# Patient Record
Sex: Female | Born: 1990 | Hispanic: No | Marital: Married | Smoking: Never smoker
Health system: Southern US, Community
[De-identification: ages and names within clinical notes are randomized; demographics above are authoritative.]

## PROBLEM LIST (undated history)

## (undated) DIAGNOSIS — E039 Hypothyroidism, unspecified: Secondary | ICD-10-CM

## (undated) HISTORY — PX: NO PAST SURGERIES: SHX2092

## (undated) HISTORY — DX: Hypothyroidism, unspecified: E03.9

---

## 2020-01-01 ENCOUNTER — Other Ambulatory Visit: Payer: Self-pay | Admitting: Obstetrics & Gynecology

## 2020-01-01 DIAGNOSIS — N6310 Unspecified lump in the right breast, unspecified quadrant: Secondary | ICD-10-CM

## 2020-01-04 ENCOUNTER — Other Ambulatory Visit: Payer: Self-pay

## 2020-01-04 ENCOUNTER — Ambulatory Visit
Admission: RE | Admit: 2020-01-04 | Discharge: 2020-01-04 | Disposition: A | Discharge status: Home / Self Care | Payer: BC Managed Care – PPO | Source: Ambulatory Visit | Attending: Obstetrics & Gynecology | Admitting: Obstetrics & Gynecology

## 2020-01-04 ENCOUNTER — Other Ambulatory Visit: Payer: Self-pay | Admitting: Obstetrics & Gynecology

## 2020-01-04 DIAGNOSIS — N6310 Unspecified lump in the right breast, unspecified quadrant: Secondary | ICD-10-CM

## 2020-07-08 ENCOUNTER — Other Ambulatory Visit: Payer: BC Managed Care – PPO

## 2020-09-01 ENCOUNTER — Other Ambulatory Visit: Payer: BC Managed Care – PPO

## 2020-10-08 ENCOUNTER — Ambulatory Visit
Admission: RE | Admit: 2020-10-08 | Discharge: 2020-10-08 | Disposition: A | Discharge status: Home / Self Care | Payer: 59 | Source: Ambulatory Visit | Attending: Obstetrics & Gynecology | Admitting: Obstetrics & Gynecology

## 2020-10-08 ENCOUNTER — Other Ambulatory Visit: Payer: Self-pay

## 2020-10-08 ENCOUNTER — Other Ambulatory Visit: Payer: Self-pay | Admitting: Obstetrics & Gynecology

## 2020-10-08 DIAGNOSIS — N6310 Unspecified lump in the right breast, unspecified quadrant: Secondary | ICD-10-CM

## 2021-04-13 ENCOUNTER — Inpatient Hospital Stay: Admission: RE | Admit: 2021-04-13 | Payer: 59 | Source: Ambulatory Visit

## 2021-06-18 LAB — OB RESULTS CONSOLE GC/CHLAMYDIA
Chlamydia: NEGATIVE
Neisseria Gonorrhea: NEGATIVE

## 2021-06-18 LAB — OB RESULTS CONSOLE ABO/RH: RH Type: POSITIVE

## 2021-06-18 LAB — OB RESULTS CONSOLE HIV ANTIBODY (ROUTINE TESTING): HIV: NONREACTIVE

## 2021-06-18 LAB — OB RESULTS CONSOLE HEPATITIS B SURFACE ANTIGEN: Hepatitis B Surface Ag: NEGATIVE

## 2021-06-18 LAB — OB RESULTS CONSOLE RPR: RPR: NONREACTIVE

## 2021-06-18 LAB — OB RESULTS CONSOLE RUBELLA ANTIBODY, IGM: Rubella: IMMUNE

## 2021-06-28 NOTE — L&D Delivery Note (Addendum)
Delivery Note At 12:38 AM a viable and healthy female was delivered via Vaginal, Spontaneous (Presentation: Right Occiput Anterior).  APGAR: 8, 9; weight  .pending  Placenta status: Spontaneous;Pathology, Intact.  Cord: 3 vessels with the following complications: Loose nuchal., reduced over shoulder.  Cord pH: NA  Thick pea soup meconium noted and baby hot on palpation but transitioned well   Anesthesia: Epidural Episiotomy: None Lacerations: bilateral vaginal;2nd degree;Perineal Suture Repair: 3.0 vicryl rapide Est. Blood Loss (mL):  400  Mom to postpartum.  Baby to Couplet care / Skin to Skin.  Robley Fries 01/12/2022, 1:20 AM

## 2021-10-05 ENCOUNTER — Other Ambulatory Visit (HOSPITAL_COMMUNITY): Payer: Self-pay | Admitting: Obstetrics & Gynecology

## 2021-10-05 DIAGNOSIS — N6310 Unspecified lump in the right breast, unspecified quadrant: Secondary | ICD-10-CM

## 2021-11-05 ENCOUNTER — Other Ambulatory Visit: Payer: Self-pay | Admitting: Obstetrics & Gynecology

## 2021-11-05 DIAGNOSIS — R590 Localized enlarged lymph nodes: Secondary | ICD-10-CM

## 2021-11-19 ENCOUNTER — Ambulatory Visit
Admission: RE | Admit: 2021-11-19 | Discharge: 2021-11-19 | Disposition: A | Discharge status: Home / Self Care | Payer: 59 | Source: Ambulatory Visit | Attending: Obstetrics & Gynecology | Admitting: Obstetrics & Gynecology

## 2021-11-19 DIAGNOSIS — R590 Localized enlarged lymph nodes: Secondary | ICD-10-CM

## 2021-12-08 ENCOUNTER — Other Ambulatory Visit: Payer: Self-pay | Admitting: Obstetrics & Gynecology

## 2021-12-08 DIAGNOSIS — Z3689 Encounter for other specified antenatal screening: Secondary | ICD-10-CM

## 2021-12-09 ENCOUNTER — Encounter: Payer: Self-pay | Admitting: *Deleted

## 2021-12-09 ENCOUNTER — Ambulatory Visit: Payer: 59

## 2021-12-10 ENCOUNTER — Encounter: Payer: Self-pay | Admitting: *Deleted

## 2021-12-10 ENCOUNTER — Ambulatory Visit: Payer: 59

## 2021-12-10 ENCOUNTER — Ambulatory Visit: Payer: 59 | Admitting: *Deleted

## 2021-12-10 ENCOUNTER — Ambulatory Visit: Payer: 59 | Attending: Obstetrics & Gynecology

## 2021-12-10 VITALS — BP 109/73 | HR 98 | Ht 65.0 in

## 2021-12-10 DIAGNOSIS — E039 Hypothyroidism, unspecified: Secondary | ICD-10-CM | POA: Diagnosis present

## 2021-12-10 DIAGNOSIS — Z3689 Encounter for other specified antenatal screening: Secondary | ICD-10-CM

## 2021-12-10 DIAGNOSIS — O99283 Endocrine, nutritional and metabolic diseases complicating pregnancy, third trimester: Secondary | ICD-10-CM | POA: Diagnosis present

## 2021-12-18 LAB — OB RESULTS CONSOLE GBS: GBS: NEGATIVE

## 2022-01-10 ENCOUNTER — Inpatient Hospital Stay (EMERGENCY_DEPARTMENT_HOSPITAL)
Admission: AD | Admit: 2022-01-10 | Discharge: 2022-01-10 | Disposition: A | Discharge status: Home / Self Care | Payer: 59 | Source: Home / Self Care | Attending: Obstetrics | Admitting: Obstetrics

## 2022-01-10 ENCOUNTER — Encounter (HOSPITAL_COMMUNITY): Payer: Self-pay | Admitting: Obstetrics

## 2022-01-10 DIAGNOSIS — Z3689 Encounter for other specified antenatal screening: Secondary | ICD-10-CM | POA: Insufficient documentation

## 2022-01-10 DIAGNOSIS — Z3A39 39 weeks gestation of pregnancy: Secondary | ICD-10-CM | POA: Insufficient documentation

## 2022-01-10 DIAGNOSIS — O471 False labor at or after 37 completed weeks of gestation: Secondary | ICD-10-CM | POA: Insufficient documentation

## 2022-01-10 DIAGNOSIS — Z3A40 40 weeks gestation of pregnancy: Secondary | ICD-10-CM | POA: Diagnosis not present

## 2022-01-10 MED ORDER — MORPHINE SULFATE (PF) 4 MG/ML IV SOLN: 4.0000 mg | Freq: Once | INTRAVENOUS | Status: DC

## 2022-01-10 MED ORDER — ZOLPIDEM TARTRATE 5 MG PO TABS
5.0000 mg | ORAL_TABLET | Freq: Once | ORAL | Status: AC
  Administered 2022-01-10: 5 mg via ORAL
  Filled 2022-01-10: qty 1

## 2022-01-10 MED ORDER — MORPHINE SULFATE (PF) 4 MG/ML IV SOLN
4.0000 mg | Freq: Once | INTRAVENOUS | Status: AC
  Administered 2022-01-10: 4 mg via INTRAMUSCULAR
  Filled 2022-01-10: qty 1

## 2022-01-10 NOTE — MAU Provider Note (Signed)
None      S: Ms. Benny Henrie is a 31 y.o. G1P0 at [redacted]w[redacted]d  who presents to MAU today complaining contractions. She was evaluated, by the nurse, and after two hours no cervical change was noted. She  reports pain medication and provider to bedside.  Patient reports she has not slept in 2 days and is scheduled for eIOL on July 20th.  She reports normal fetal movement and denies LOF or VB.   O: BP 121/62 (BP Location: Right Arm)   Pulse 92   Temp 97.7 F (36.5 C) (Oral)   Resp 17   Ht 5\' 5"  (1.651 m)   Wt 80.9 kg   LMP 04/06/2021   SpO2 98%   BMI 29.69 kg/m  GENERAL: Well-developed, well-nourished female in no acute distress.  HEAD: Normocephalic, atraumatic.  CHEST: Normal effort of breathing, regular heart rate ABDOMEN: Soft, nontender, gravid  Cervical exam:  Dilation: 1 Effacement (%): 60 Cervical Position: Posterior Station: -2 Presentation: Vertex Exam by:: [email or web address] RN   Fetal Monitoring: Baseline: 135 Variability: Moderate Accelerations: Present 15x15 Decelerations: None Contractions: Q6-7 min   A: SIUP at [redacted]w[redacted]d  False labor Cat I FT Contraction Pain  P: -Briefly educated on latent labor.  -Reviewed therapeutic rest regimen of morphine and ambien. -Discussed anticipated effects and potential side effects.  -Patient and husband verbalizes understanding and desires to proceed.-Encouraged to go home and rest.  -Morphine 4mg  IM and Ambien 5mg  orally ordered. -NST reactive -Instructed to return to hospital prn for labor onset or other pregnancy related concerns. -Discharge to home in stable condition.   [redacted]w[redacted]d, CNM 01/10/2022 7:04 AM

## 2022-01-10 NOTE — MAU Note (Signed)
Taylor Wong is a 31 y.o. at [redacted]w[redacted]d here in MAU reporting: ctx that started yesterday morning around 0300 but weren't consistent.Marland Kitchen around 0200 today ctx started to be 5 minutes apart. Denies VB or LOF. +FM.    Onset of complaint: 0200 Pain score: 8 Vitals:   01/10/22 0426  BP: 121/77  Pulse: (!) 112  Resp: 19  Temp: 97.9 F (36.6 C)  SpO2: 94%    FHT: pt wearing dress - will apply FHR monitor in room.  Lab orders placed from triage: labor eval.

## 2022-01-11 ENCOUNTER — Inpatient Hospital Stay (HOSPITAL_COMMUNITY): Payer: 59 | Admitting: Anesthesiology

## 2022-01-11 ENCOUNTER — Other Ambulatory Visit: Payer: Self-pay | Admitting: Obstetrics & Gynecology

## 2022-01-11 ENCOUNTER — Other Ambulatory Visit: Payer: Self-pay

## 2022-01-11 ENCOUNTER — Inpatient Hospital Stay (HOSPITAL_COMMUNITY): Admit: 2022-01-11 | Payer: Self-pay

## 2022-01-11 ENCOUNTER — Inpatient Hospital Stay (HOSPITAL_COMMUNITY)
Admission: AD | Admit: 2022-01-11 | Discharge: 2022-01-14 | DRG: 805 | Disposition: A | Discharge status: Home / Self Care | Payer: 59 | Attending: Obstetrics & Gynecology | Admitting: Obstetrics & Gynecology

## 2022-01-11 ENCOUNTER — Encounter (HOSPITAL_COMMUNITY): Payer: Self-pay | Admitting: Obstetrics and Gynecology

## 2022-01-11 DIAGNOSIS — O411290 Chorioamnionitis, unspecified trimester, not applicable or unspecified: Secondary | ICD-10-CM | POA: Diagnosis not present

## 2022-01-11 DIAGNOSIS — Z3A40 40 weeks gestation of pregnancy: Secondary | ICD-10-CM

## 2022-01-11 DIAGNOSIS — O99284 Endocrine, nutritional and metabolic diseases complicating childbirth: Secondary | ICD-10-CM | POA: Diagnosis present

## 2022-01-11 DIAGNOSIS — O9902 Anemia complicating childbirth: Secondary | ICD-10-CM | POA: Diagnosis present

## 2022-01-11 DIAGNOSIS — Z3689 Encounter for other specified antenatal screening: Secondary | ICD-10-CM | POA: Diagnosis not present

## 2022-01-11 DIAGNOSIS — O26893 Other specified pregnancy related conditions, third trimester: Secondary | ICD-10-CM | POA: Diagnosis present

## 2022-01-11 DIAGNOSIS — E039 Hypothyroidism, unspecified: Secondary | ICD-10-CM | POA: Diagnosis present

## 2022-01-11 DIAGNOSIS — O411230 Chorioamnionitis, third trimester, not applicable or unspecified: Secondary | ICD-10-CM | POA: Diagnosis present

## 2022-01-11 LAB — CBC
HCT: 40.9 % (ref 36.0–46.0)
Hemoglobin: 12.8 g/dL (ref 12.0–15.0)
MCH: 22.5 pg — ABNORMAL LOW (ref 26.0–34.0)
MCHC: 31.3 g/dL (ref 30.0–36.0)
MCV: 71.8 fL — ABNORMAL LOW (ref 80.0–100.0)
Platelets: 332 10*3/uL (ref 150–400)
RBC: 5.7 MIL/uL — ABNORMAL HIGH (ref 3.87–5.11)
RDW: 18.3 % — ABNORMAL HIGH (ref 11.5–15.5)
WBC: 10.4 10*3/uL (ref 4.0–10.5)
nRBC: 0 % (ref 0.0–0.2)

## 2022-01-11 LAB — TYPE AND SCREEN
ABO/RH(D): B POS
Antibody Screen: NEGATIVE

## 2022-01-11 LAB — RPR: RPR Ser Ql: NONREACTIVE

## 2022-01-11 MED ORDER — PHENYLEPHRINE 80 MCG/ML (10ML) SYRINGE FOR IV PUSH (FOR BLOOD PRESSURE SUPPORT)
80.0000 ug | PREFILLED_SYRINGE | INTRAVENOUS | Status: DC | PRN
  Filled 2022-01-11: qty 10

## 2022-01-11 MED ORDER — OXYTOCIN BOLUS FROM INFUSION
333.0000 mL | Freq: Once | INTRAVENOUS | Status: AC
  Administered 2022-01-12: 333 mL via INTRAVENOUS

## 2022-01-11 MED ORDER — PHENYLEPHRINE 80 MCG/ML (10ML) SYRINGE FOR IV PUSH (FOR BLOOD PRESSURE SUPPORT): 80.0000 ug | PREFILLED_SYRINGE | INTRAVENOUS | Status: DC | PRN

## 2022-01-11 MED ORDER — DIPHENHYDRAMINE HCL 50 MG/ML IJ SOLN: 12.5000 mg | INTRAMUSCULAR | Status: DC | PRN

## 2022-01-11 MED ORDER — MORPHINE SULFATE (PF) 4 MG/ML IV SOLN
4.0000 mg | Freq: Once | INTRAVENOUS | Status: AC
  Administered 2022-01-11: 4 mg via INTRAMUSCULAR
  Filled 2022-01-11: qty 1

## 2022-01-11 MED ORDER — FENTANYL-BUPIVACAINE-NACL 0.5-0.125-0.9 MG/250ML-% EP SOLN
12.0000 mL/h | EPIDURAL | Status: DC | PRN
  Administered 2022-01-11: 12 mL/h via EPIDURAL
  Filled 2022-01-11: qty 250

## 2022-01-11 MED ORDER — OXYCODONE-ACETAMINOPHEN 5-325 MG PO TABS: 2.0000 | ORAL_TABLET | ORAL | Status: DC | PRN

## 2022-01-11 MED ORDER — EPHEDRINE 5 MG/ML INJ: 10.0000 mg | INTRAVENOUS | Status: DC | PRN

## 2022-01-11 MED ORDER — LIDOCAINE HCL (PF) 1 % IJ SOLN: 30.0000 mL | INTRAMUSCULAR | Status: DC | PRN

## 2022-01-11 MED ORDER — SOD CITRATE-CITRIC ACID 500-334 MG/5ML PO SOLN: 30.0000 mL | ORAL | Status: DC | PRN

## 2022-01-11 MED ORDER — ACETAMINOPHEN 325 MG PO TABS
650.0000 mg | ORAL_TABLET | ORAL | Status: DC | PRN
  Administered 2022-01-11 (×2): 650 mg via ORAL
  Filled 2022-01-11 (×2): qty 2

## 2022-01-11 MED ORDER — OXYTOCIN-SODIUM CHLORIDE 30-0.9 UT/500ML-% IV SOLN
2.5000 [IU]/h | INTRAVENOUS | Status: DC
  Administered 2022-01-12: 2.5 [IU]/h via INTRAVENOUS

## 2022-01-11 MED ORDER — OXYCODONE-ACETAMINOPHEN 5-325 MG PO TABS: 1.0000 | ORAL_TABLET | ORAL | Status: DC | PRN

## 2022-01-11 MED ORDER — LACTATED RINGERS IV SOLN: INTRAVENOUS | Status: DC

## 2022-01-11 MED ORDER — SODIUM CHLORIDE 0.9 % IV SOLN
2.0000 g | Freq: Four times a day (QID) | INTRAVENOUS | Status: DC
  Administered 2022-01-11 – 2022-01-12 (×2): 2 g via INTRAVENOUS
  Filled 2022-01-11 (×4): qty 2

## 2022-01-11 MED ORDER — LACTATED RINGERS IV SOLN
500.0000 mL | Freq: Once | INTRAVENOUS | Status: AC
  Administered 2022-01-11: 500 mL via INTRAVENOUS

## 2022-01-11 MED ORDER — ONDANSETRON HCL 4 MG/2ML IJ SOLN
4.0000 mg | Freq: Once | INTRAMUSCULAR | Status: AC
Start: 2022-01-11 — End: 2022-01-11
  Administered 2022-01-11: 4 mg via INTRAMUSCULAR
  Filled 2022-01-11: qty 2

## 2022-01-11 MED ORDER — LIDOCAINE HCL (PF) 1 % IJ SOLN
INTRAMUSCULAR | Status: DC | PRN
  Administered 2022-01-11 (×2): 4 mL via EPIDURAL

## 2022-01-11 MED ORDER — ONDANSETRON HCL 4 MG/2ML IJ SOLN: 4.0000 mg | Freq: Four times a day (QID) | INTRAMUSCULAR | Status: DC | PRN

## 2022-01-11 MED ORDER — LACTATED RINGERS IV SOLN
500.0000 mL | INTRAVENOUS | Status: DC | PRN
  Administered 2022-01-11 (×2): 500 mL via INTRAVENOUS

## 2022-01-11 MED ORDER — TERBUTALINE SULFATE 1 MG/ML IJ SOLN
0.2500 mg | Freq: Once | INTRAMUSCULAR | Status: DC | PRN
Start: 2022-01-11 — End: 2022-01-12

## 2022-01-11 MED ORDER — FLEET ENEMA 7-19 GM/118ML RE ENEM: 1.0000 | ENEMA | RECTAL | Status: DC | PRN

## 2022-01-11 MED ORDER — OXYTOCIN-SODIUM CHLORIDE 30-0.9 UT/500ML-% IV SOLN
1.0000 m[IU]/min | INTRAVENOUS | Status: DC
  Administered 2022-01-11: 2 m[IU]/min via INTRAVENOUS
  Filled 2022-01-11: qty 500

## 2022-01-11 NOTE — H&P (Addendum)
Taylor Wong is a 31 y.o. female G1P0 [redacted]w[redacted]d presenting for early labor. Patient reported contractions since last night. Had been seen in MAU yesterday morning with complaints of contractions but no cervical change and was discharged home. No VB or LOF. Good FM.   Pregnancy dated by LMP c/w 8w sono.Routine prenatal care at [facility] with Dr. Juliene Pina as primary provider. Routine prenatal labs were within normal limits. She had a low risk Ultrascreen and negative AFP1 screen. Fetal anatomy scan significant for bilateral mild fetal pyelectasis at 19 weeks. Follow up scan at 27 weeks showed pyelectasis had resolved but prominent lateral ventricles noted. Repeat US at 34 weeks done showed mild left ventriculomegaly at 10.4 mm and again normal kidneys noted. She was then seen by MFM at 35 weeks where normal ventricles noted, no concerns, and vertex presentation with EFW 6#1 (58%). Pregnancy significant for hypothyroidism on levothyroxine 50 mcg daily with stable TFTs throughout the pregnancy. Normal 1hr GTT of 114, mild anemia Hgb 10.8 at 28 week check. Has been on PO Fe supplement since then. GBS NEG.  OB History     Gravida  1   Para      Term      Preterm      AB      Living         SAB      IAB      Ectopic      Multiple      Live Births             Past Medical History:  Diagnosis Date   Hypothyroidism    Past Surgical History:  Procedure Laterality Date   NO PAST SURGERIES     Family History: family history is not on file. Social History:  reports that she has never smoked. She has never used smokeless tobacco. She reports that she does not drink alcohol and does not use drugs.     Maternal Diabetes: No Genetic Screening: Normal Maternal Ultrasounds/Referrals: Normal Fetal anatomy scan significant for bilateral mild fetal pyelectasis at 19 weeks. Follow up scan at 27 weeks showed pyelectasis had resolved but prominent lateral ventricles noted. Repeat US at 34  weeks done showed mild left ventriculomegaly at 10.4 mm and again normal kidneys noted. She was then seen by MFM at 35 weeks where normal ventricles noted, no concerns Fetal Ultrasounds or other Referrals:  Referred to Materal Fetal Medicine  Maternal Substance Abuse:  No Significant Maternal Medications:  Meds include: Syntroid Significant Maternal Lab Results:  Group B Strep negative Other Comments:  None  Review of Systems  All other systems reviewed and are negative.  Per HPI Maternal Exam:  Abdomen: Estimated fetal weight is 6#1 at 35 weeks.   Fetal presentation: vertex Introitus: Normal vulva.   Fetal Exam Fetal State Assessment: Category I - tracings are normal.   Physical Exam Vitals and nursing note reviewed.  Constitutional:      Appearance: Normal appearance.  HENT:     Head: Normocephalic.  Cardiovascular:     Rate and Rhythm: Normal rate.  Pulmonary:     Effort: Pulmonary effort is normal.  Abdominal:     Tenderness: There is no abdominal tenderness.  Genitourinary:    General: Normal vulva.  Musculoskeletal:        General: Normal range of motion.     Cervical back: Normal range of motion.  Skin:    General: Skin is warm and dry.  Neurological:  General: No focal deficit present.     Mental Status: She is alert and oriented to person, place, and time.     Dilation: 2.5 Effacement (%): 80 Station: -2 Exam by:: katie jones Blood pressure 120/84, pulse (!) 132, temperature 98.6 F (37 C), temperature source Axillary, resp. rate 18, height 5\' 5"  (1.651 m), weight 80.7 kg, last menstrual period 04/06/2021, SpO2 97 %.  Prenatal labs: ABO, Rh:  --/--/B POS (07/17 1000) Antibody: NEG (07/17 1000) Rubella:  IMMUNE RPR:   NON-REACTIVE HBsAg:   NEGATIVE HIV:   NON-REACTIVE GBS: Negative/-- (06/23 0000)   ChemistryNo results for input(s): "NA", "K", "CL", "CO2", "GLUCOSE", "BUN", "CREATININE", "CALCIUM", "GFRNONAA", "GFRAA", "ANIONGAP" in the last 168  hours.  No results for input(s): "PROT", "ALBUMIN", "AST", "ALT", "ALKPHOS", "BILITOT" in the last 168 hours. Hematology Recent Labs  Lab 01/11/22 1000  WBC 10.4  RBC 5.70*  HGB 12.8  HCT 40.9  MCV 71.8*  MCH 22.5*  MCHC 31.3  RDW 18.3*  PLT 332   Cardiac EnzymesNo results for input(s): "TROPONINI" in the last 168 hours. No results for input(s): "TROPIPOC" in the last 168 hours.  BNPNo results for input(s): "BNP", "PROBNP" in the last 168 hours.  DDimer No results for input(s): "DDIMER" in the last 168 hours.   Assessment/Plan: Taylor Wong is a 31 y.o. female G1P0 [redacted]w[redacted]d admitted with early labor at term  -Admit to LD -Routine admission labs -Labor augmentation with Pitocin -Cont EFM/Toco- Cat I  -May have epidural pain mgmt on request -GBS NEG, Rh POS -Hypothyroid, cont home Levo 50 mcg daily -Routine intrapartum care -Anticipate SVD  Cassandra A Law 01/11/2022, 11:54 AM

## 2022-01-11 NOTE — Progress Notes (Signed)
RN called w/ fever, fetal variability decreased, low urine op. Pt pretty certain no PPROM.  Arom in house few hrs back   BP (!) 112/57   Pulse (!) 119   Temp (!) 101.1 F (38.4 C) (Axillary)   Resp 18   Ht 5\' 5"  (1.651 m)   Wt 80.7 kg   LMP 04/06/2021   SpO2 100%   BMI 29.62 kg/m  Maternal tachycardia but since admission   FHT baseline change now from 150 to 165, minimal variability. No decels. + accels.   possible chorio, start Mefoxin 2 gm q  hrs

## 2022-01-11 NOTE — Progress Notes (Addendum)
Taylor Wong is a 31 y.o. G1P0 at [redacted]w[redacted]d, early labor admit  Subjective: Right leg is hurting. Has been in right lateral and when turned to left lateral starting having severe right thigh pain.   Objective: BP 116/72   Pulse (!) 143   Temp (!) 102.7 F (39.3 C) (Axillary)   Resp 17   Ht 5\' 5"  (1.651 m)   Wt 80.7 kg   LMP 04/06/2021   SpO2 100%   BMI 29.62 kg/m  Patient Vitals for the past 24 hrs:  BP Temp Temp src Pulse Resp SpO2 Height Weight  01/11/22 2230 116/72 (!) 102.7 F (39.3 C) Axillary (!) 143 -- -- -- --  01/11/22 2200 111/65 99.4 F (37.4 C) Oral (!) 131 17 -- -- --  01/11/22 2130 97/60 -- -- (!) 136 16 -- -- --  01/11/22 2100 115/60 -- -- (!) 131 17 -- -- --  01/11/22 2031 (!) 134/98 -- -- -- 17 -- -- --  01/11/22 2001 (!) 124/57 -- -- (!) 122 17 -- -- --  01/11/22 1930 113/60 100 F (37.8 C) Oral (!) 130 -- -- -- --  01/11/22 1900 114/66 -- -- (!) 112 -- -- -- --  01/11/22 1830 120/75 (!) 101.1 F (38.4 C) Axillary (!) 118 -- -- -- --  01/11/22 1800 (!) 112/57 -- -- (!) 119 -- -- -- --  01/11/22 1733 -- 100.3 F (37.9 C) Axillary -- -- -- -- --  01/11/22 1731 104/74 -- -- (!) 112 -- -- -- --  01/11/22 1708 -- 100 F (37.8 C) Axillary -- -- -- -- --  01/11/22 1701 108/61 -- -- (!) 112 -- -- -- --  01/11/22 1632 (!) 114/57 -- -- (!) 116 -- -- -- --  01/11/22 1628 -- 99.3 F (37.4 C) Axillary -- -- -- -- --  01/11/22 1618 122/90 -- -- (!) 120 -- -- -- --  01/11/22 1530 118/75 -- -- (!) 105 -- -- -- --  01/11/22 1500 111/70 -- -- (!) 103 -- -- -- --  01/11/22 1431 115/75 98.6 F (37 C) Axillary (!) 102 -- -- -- --  01/11/22 1400 124/76 -- -- (!) 106 -- -- -- --  01/11/22 1337 103/89 -- -- (!) 113 -- -- -- --  01/11/22 1335 103/89 -- -- (!) 113 -- -- -- --  01/11/22 1330 116/67 -- -- (!) 114 -- -- -- --  01/11/22 1320 109/87 -- -- (!) 113 -- 100 % -- --  01/11/22 1316 125/80 -- -- (!) 125 -- 99 % -- --  01/11/22 1312 114/67 -- -- (!) 143 -- -- -- --   01/11/22 1309 -- -- -- -- -- 100 % -- --  01/11/22 1307 (!) 120/52 -- -- (!) 145 18 -- -- --  01/11/22 1301 123/89 -- -- (!) 103 -- -- -- --  01/11/22 1238 111/74 -- -- (!) 105 -- -- -- --  01/11/22 1130 -- 98.6 F (37 C) Axillary -- -- -- -- --  01/11/22 1110 -- -- -- -- 18 -- 5\' 5"  (1.651 m) 80.7 kg  01/11/22 0852 120/84 (!) 97.5 F (36.4 C) Oral (!) 132 20 97 % -- --  01/11/22 0849 -- -- -- -- -- -- 5\' 5"  (1.651 m) 80.9 kg     FHT:  FHR: 180s  bpm, variability: minimal ,  accelerations:  Abscent,  decelerations:  Present intermittent variable decels, now possible late decels. Baseline change from 140-150s at admission to 180s  for at least 3- hrs since maternal fever.  UC:   Regular on external toco. IUP placed since Cx unchanges in 2.1/2 hrs. IUPC not picking up well  SVE:   Dilation: 6 Effacement (%): 100 Station: -1 Exam by:: Amirr Achord MD No change since last exam before 8 pm Vaginal exam - feels hot   Assessment / Plan: Early labor, AOL with pitocin. S/p AROM in hospital but suspect leaking and maternal fever, maternal fetal tachycardia c/w Chorioamnionitis  On Cefoxitin 2 gm q 6 hrs  Start IV bolus 200 cc and reassess if temp/tachy better  GBS neg Cervix unchanged, IUPC placed. If no progress or fetal concerns, will proceed w/ C-section, patient agrees  Stat CBC, CMP   Robley Fries, MD 01/11/2022, 11:03 PM

## 2022-01-11 NOTE — Anesthesia Preprocedure Evaluation (Signed)
Anesthesia Evaluation  Patient identified by MRN, date of birth, ID band Patient awake    Reviewed: Allergy & Precautions, Patient's Chart, lab work & pertinent test results  History of Anesthesia Complications Negative for: history of anesthetic complications  Airway Mallampati: II  TM Distance: >3 FB Neck ROM: Full    Dental no notable dental hx.    Pulmonary neg pulmonary ROS,    Pulmonary exam normal        Cardiovascular negative cardio ROS Normal cardiovascular exam     Neuro/Psych negative neurological ROS  negative psych ROS   GI/Hepatic negative GI ROS, Neg liver ROS,   Endo/Other  Hypothyroidism   Renal/GU negative Renal ROS  negative genitourinary   Musculoskeletal negative musculoskeletal ROS (+)   Abdominal   Peds  Hematology negative hematology ROS (+)   Anesthesia Other Findings Day of surgery medications reviewed with patient.  Reproductive/Obstetrics (+) Pregnancy                             Anesthesia Physical Anesthesia Plan  ASA: 2  Anesthesia Plan: Epidural   Post-op Pain Management:    Induction:   PONV Risk Score and Plan: Treatment may vary due to age or medical condition  Airway Management Planned: Natural Airway  Additional Equipment: Fetal Monitoring  Intra-op Plan:   Post-operative Plan:   Informed Consent: I have reviewed the patients History and Physical, chart, labs and discussed the procedure including the risks, benefits and alternatives for the proposed anesthesia with the patient or authorized representative who has indicated his/her understanding and acceptance.       Plan Discussed with:   Anesthesia Plan Comments:         Anesthesia Quick Evaluation

## 2022-01-11 NOTE — Progress Notes (Addendum)
Taylor Wong is a 31 y.o. G1P0 at [redacted]w[redacted]d by LMP admitted for early labor, she had been in MAU over the weekend .  Subjective: Feels well since epidural   Objective: BP 108/61   Pulse (!) 112   Temp 100 F (37.8 C) (Axillary)   Resp 18   Ht 5\' 5"  (1.651 m)   Wt 80.7 kg   LMP 04/06/2021   SpO2 100%   BMI 29.62 kg/m  No intake/output data recorded. No intake/output data recorded.  FHT:  FHR: 150 bpm, variability: moderate,  accelerations:  Present,  decelerations:  Absent UC:   regular, every 3-4 minutes on pitocin SVE:   Dilation: 3.5 Effacement (%): 100 Station: -2 Exam by:: dr. Wallice Granville Slightly warn on vaginal exam. Mec per AROM. No foul odor   Labs: Lab Results  Component Value Date   WBC 10.4 01/11/2022   HGB 12.8 01/11/2022   HCT 40.9 01/11/2022   MCV 71.8 (L) 01/11/2022   PLT 332 01/11/2022    Assessment / Plan: Labor augmentation, early labor.   Fetal Wellbeing:  Category I Pain Control:  Epidural I/D:   GBS neg  Anticipated MOD:  working towards SVD    01/13/2022, MD 01/11/2022, 5:31 PM

## 2022-01-11 NOTE — Progress Notes (Signed)
S: Comfortable with epidural. Discussed the R/B/A of AROM for labor augmentation and patient consents to procedure. Husband, Ajay, present and supportive. Eagerly anticipating a baby girl with an undecided name.   O: Vitals:   01/11/22 1320 01/11/22 1330 01/11/22 1335 01/11/22 1337  BP: 109/87 116/67 103/89 103/89  Pulse: (!) 113 (!) 114 (!) 113 (!) 113  Resp:      Temp:      TempSrc:      SpO2: 100%     Weight:      Height:       FHT:  FHR: 135 bpm, variability: moderate,  accelerations:  Present,  decelerations:  Absent UC:   irregular, every 3-5.5 minutes SVE:   Dilation: 3 Effacement (%): 90 Station: -2 Exam by: A. Elana Jian CNM  AROM of a scant amount of thick meconium fluid at 1355.   A / P: Augmentation of labor, progressing well, Pitocin infusing  Fetal Wellbeing:  Category I GBS: Negative Pain Control:  Epidural Anticipated MOD:  NSVD  Will continue to increase Pitocin 2x2 until active labor.   Dr. Juliene Pina updated on patient status and plan of care.   June Leap, CNM, MSN 01/11/2022, 2:06 PM

## 2022-01-11 NOTE — MAU Provider Note (Signed)
Event Date/Time   First Provider Initiated Contact with Patient 01/11/22 [phone]       S: Ms. Taylor Wong is a 31 y.o. G1P0 at [redacted]w[redacted]d  who presents to MAU today complaining of regular contractions since last night. She denies vaginal bleeding. She denies LOF. She reports normal fetal movement. Rates pain 9/10.   O: BP 120/84 (BP Location: Right Arm)   Pulse (!) 132   Temp (!) 97.5 F (36.4 C) (Oral)   Resp 20   LMP 04/06/2021   SpO2 97%  GENERAL: Well-developed, well-nourished female in no acute distress.  HEAD: Normocephalic, atraumatic.  CHEST: Normal effort of breathing, regular heart rate ABDOMEN: Soft, nontender, gravid  Cervical exam:  Dilation: 1.5 Effacement (%): 70 Cervical Position: Middle Station: -2 Presentation: Vertex Exam by:: Holly Flippin RN  Fetal Monitoring: Baseline: 140 Variability: mod Accelerations: + Decelerations: no Contractions: 2-4  MDM: Therapeutic rest per pt request. Morphine and Zofran ordered. 0930: Occasional variable noted on tracing, recommend admit for early labor/post dates. RN to notify MD.  A: SIUP at [redacted]w[redacted]d  Early labor Reactive NST  P: Admit to LD Mngt per Dr. Lorayne Marek, Doyle, CNM 01/11/2022 9:09 AM

## 2022-01-11 NOTE — MAU Note (Signed)
.  Taylor Wong is a 31 y.o. at [redacted]w[redacted]d here in MAU reporting: contractions that have continued since last night. She states that they are now closer together and more intense. Denies VB or LOF. +FM.   Pain score: 9

## 2022-01-11 NOTE — Anesthesia Procedure Notes (Signed)
Epidural Patient location during procedure: OB Start time: 01/11/2022 12:55 PM End time: 01/11/2022 1:00 PM  Staffing Anesthesiologist: Kaylyn Layer, MD Performed: anesthesiologist   Preanesthetic Checklist Completed: patient identified, IV checked, risks and benefits discussed, monitors and equipment checked, pre-op evaluation and timeout performed  Epidural Patient position: sitting Prep: DuraPrep and site prepped and draped Patient monitoring: continuous pulse ox, blood pressure and heart rate Approach: midline Location: L2-L3 Injection technique: LOR air  Needle:  Needle type: Tuohy  Needle gauge: 17 G Needle length: 9 cm Catheter type: closed end flexible Catheter size: 19 Gauge Catheter at skin depth: 8 cm Test dose: negative and Other (1% lidocaine)  Assessment Events: blood not aspirated, injection not painful, no injection resistance, no paresthesia and negative IV test  Additional Notes Patient identified. Risks, benefits, and alternatives discussed with patient including but not limited to bleeding, infection, nerve damage, paralysis, failed block, incomplete pain control, headache, blood pressure changes, nausea, vomiting, reactions to medication, itching, and postpartum back pain. Confirmed with bedside nurse the patient's most recent platelet count. Confirmed with patient that they are not currently taking any anticoagulation, have any bleeding history, or any family history of bleeding disorders. Patient expressed understanding and wished to proceed. All questions were answered. Sterile technique was used throughout the entire procedure. Please see nursing notes for vital signs.   First attempt at L3-4 with unintentional dural puncture. Second attempt at L2-3 with crisp LOR between 3-3.5cm, catheter threaded easily. Test dose was given through epidural catheter and negative prior to continuing to dose epidural or start infusion. Warning signs of high block given to  the patient including shortness of breath, tingling/numbness in hands, complete motor block, or any concerning symptoms with instructions to call for help. Patient was given instructions on fall risk and not to get out of bed. All questions and concerns addressed with instructions to call with any issues or inadequate analgesia.  Reason for block:procedure for pain

## 2022-01-12 ENCOUNTER — Encounter (HOSPITAL_COMMUNITY): Payer: Self-pay | Admitting: Obstetrics and Gynecology

## 2022-01-12 DIAGNOSIS — E039 Hypothyroidism, unspecified: Secondary | ICD-10-CM | POA: Diagnosis present

## 2022-01-12 LAB — CBC
HCT: 36.1 % (ref 36.0–46.0)
Hemoglobin: 11.2 g/dL — ABNORMAL LOW (ref 12.0–15.0)
MCH: 22.4 pg — ABNORMAL LOW (ref 26.0–34.0)
MCHC: 31 g/dL (ref 30.0–36.0)
MCV: 72.1 fL — ABNORMAL LOW (ref 80.0–100.0)
Platelets: 267 10*3/uL (ref 150–400)
RBC: 5.01 MIL/uL (ref 3.87–5.11)
RDW: 17.8 % — ABNORMAL HIGH (ref 11.5–15.5)
WBC: 19.6 10*3/uL — ABNORMAL HIGH (ref 4.0–10.5)
nRBC: 0 % (ref 0.0–0.2)

## 2022-01-12 MED ORDER — BENZOCAINE-MENTHOL 20-0.5 % EX AERO
1.0000 | INHALATION_SPRAY | CUTANEOUS | Status: DC | PRN
  Administered 2022-01-12: 1 via TOPICAL
  Filled 2022-01-12: qty 56

## 2022-01-12 MED ORDER — ONDANSETRON HCL 4 MG/2ML IJ SOLN: 4.0000 mg | INTRAMUSCULAR | Status: DC | PRN

## 2022-01-12 MED ORDER — ONDANSETRON HCL 4 MG PO TABS: 4.0000 mg | ORAL_TABLET | ORAL | Status: DC | PRN

## 2022-01-12 MED ORDER — IBUPROFEN 600 MG PO TABS
600.0000 mg | ORAL_TABLET | Freq: Four times a day (QID) | ORAL | Status: DC
  Administered 2022-01-12 – 2022-01-14 (×9): 600 mg via ORAL
  Filled 2022-01-12 (×9): qty 1

## 2022-01-12 MED ORDER — LEVOTHYROXINE SODIUM 50 MCG PO TABS
50.0000 ug | ORAL_TABLET | Freq: Every day | ORAL | Status: DC
  Administered 2022-01-12 – 2022-01-14 (×3): 50 ug via ORAL
  Filled 2022-01-12 (×3): qty 1

## 2022-01-12 MED ORDER — DIBUCAINE (PERIANAL) 1 % EX OINT
1.0000 | TOPICAL_OINTMENT | CUTANEOUS | Status: DC | PRN
Start: 2022-01-12 — End: 2022-01-14

## 2022-01-12 MED ORDER — DIPHENHYDRAMINE HCL 25 MG PO CAPS: 25.0000 mg | ORAL_CAPSULE | Freq: Four times a day (QID) | ORAL | Status: DC | PRN

## 2022-01-12 MED ORDER — ZOLPIDEM TARTRATE 5 MG PO TABS: 5.0000 mg | ORAL_TABLET | Freq: Every evening | ORAL | Status: DC | PRN

## 2022-01-12 MED ORDER — TETANUS-DIPHTH-ACELL PERTUSSIS 5-2.5-18.5 LF-MCG/0.5 IM SUSY: 0.5000 mL | PREFILLED_SYRINGE | Freq: Once | INTRAMUSCULAR | Status: DC

## 2022-01-12 MED ORDER — ACETAMINOPHEN 325 MG PO TABS
650.0000 mg | ORAL_TABLET | ORAL | Status: DC | PRN
  Administered 2022-01-12 – 2022-01-13 (×3): 650 mg via ORAL
  Filled 2022-01-12 (×3): qty 2

## 2022-01-12 MED ORDER — SIMETHICONE 80 MG PO CHEW: 80.0000 mg | CHEWABLE_TABLET | ORAL | Status: DC | PRN

## 2022-01-12 MED ORDER — WITCH HAZEL-GLYCERIN EX PADS: 1.0000 | MEDICATED_PAD | CUTANEOUS | Status: DC | PRN

## 2022-01-12 MED ORDER — COCONUT OIL OIL: 1.0000 | TOPICAL_OIL | Status: DC | PRN

## 2022-01-12 MED ORDER — SENNOSIDES-DOCUSATE SODIUM 8.6-50 MG PO TABS
2.0000 | ORAL_TABLET | Freq: Every day | ORAL | Status: DC
  Administered 2022-01-13 – 2022-01-14 (×2): 2 via ORAL
  Filled 2022-01-12 (×2): qty 2

## 2022-01-12 MED ORDER — PRENATAL MULTIVITAMIN CH
1.0000 | ORAL_TABLET | Freq: Every day | ORAL | Status: DC
  Administered 2022-01-12 – 2022-01-14 (×3): 1 via ORAL
  Filled 2022-01-12 (×3): qty 1

## 2022-01-12 NOTE — Anesthesia Postprocedure Evaluation (Signed)
Anesthesia Post Note  Patient: Taylor Wong  Procedure(s) Performed: AN AD HOC LABOR EPIDURAL     Patient location during evaluation: Mother Baby Anesthesia Type: Epidural Level of consciousness: awake and alert and oriented Pain management: satisfactory to patient Vital Signs Assessment: post-procedure vital signs reviewed and stable Respiratory status: respiratory function stable Cardiovascular status: stable Postop Assessment: no backache, epidural receding, patient able to bend at knees, no signs of nausea or vomiting, adequate PO intake and able to ambulate Anesthetic complications: yes Comments: Wet tap documented by MDA, The patient stated that she has had neck pain and stiffness since before delivery but it has gradually improved.  She also admits to a mild headache but attributes this to a lack of sleep and an uncomfortable bed.    No notable events documented.  Last Vitals:  Vitals:   01/12/22 0404 01/12/22 0746  BP: 103/72 103/72  Pulse: (!) 116 94  Resp:  18  Temp: 36.9 C 36.8 C  SpO2: 99% 99%    Last Pain:  Vitals:   01/12/22 0746  TempSrc: Oral  PainSc:    Pain Goal:                Epidural/Spinal Function Cutaneous sensation: Normal sensation (01/12/22 0746), Patient able to flex knees: Yes (01/12/22 0746), Patient able to lift hips off bed: Yes (01/12/22 0746), Back pain beyond tenderness at insertion site: No (01/12/22 0746), Progressively worsening motor and/or sensory loss: No (01/12/22 0746), Bowel and/or bladder incontinence post epidural: No (01/12/22 0746)  Karleen Dolphin

## 2022-01-12 NOTE — Lactation Note (Addendum)
This note was copied from a baby's chart. Lactation Consultation Note  Patient Name: Girl Gitty Osterlund [id number] Date: 01/12/2022   Age:30 hours  Mother sleeping.  Spoke with FOB.  Infant in nursery for temperature stability.  Lactation will follow up later.    Consult Status Consult Status: Follow-up    Dahlia Byes [facility] 01/12/2022, 9:09 AM

## 2022-01-12 NOTE — Lactation Note (Addendum)
This note was copied from a baby's chart. Lactation Consultation Note  Patient Name: Taylor Wong [id number] Date: 01/12/2022   Age:30 hours Mom as per RN, Taylor Wong, has a headache and is resting for now. Infant formula fed. Mom to call when she is ready to work on breastfeeding.   Maternal Data    Feeding Nipple Type: Extra Slow Flow  LATCH Score                    Lactation Tools Discussed/Used    Interventions    Discharge    Consult Status      Taylor Belote  Wong 01/12/2022, 3:30 PM

## 2022-01-12 NOTE — Progress Notes (Signed)
Post Partum Day 0 Chorioamnionitis in labor, on Mefoxin.  SVD, 2nd degree lac and vaginal lacerations Girl. Was in Nursery for low temp, back w/ parents. Breast/ formula as needed  Subjective: no complaints, up ad lib, voiding, tolerating PO, and + flatus  Objective: Blood pressure 90/61, pulse 94, temperature 98.4 F (36.9 C), temperature source Oral, resp. rate 18, height 5\' 5"  (1.651 m), weight 80.7 kg, last menstrual period 04/06/2021, SpO2 98 %, unknown if currently breastfeeding. Patient Vitals for the past 24 hrs:  BP Temp Temp src Pulse Resp SpO2  01/12/22 1217 90/61 98.4 F (36.9 C) Oral 94 18 98 %  01/12/22 0746 103/72 98.3 F (36.8 C) Oral 94 18 99 %  01/12/22 0404 103/72 98.4 F (36.9 C) Oral (!) 116 -- 99 %  01/12/22 0247 116/76 99.4 F (37.4 C) Axillary 66 18 98 %  01/12/22 0215 116/69 -- -- 91 -- --  01/12/22 0200 121/70 -- -- 97 -- --  01/12/22 0145 113/71 -- -- (!) 102 -- --  01/12/22 0130 112/78 -- -- 99 -- --  01/12/22 0115 122/65 (!) 100.6 F (38.1 C) Axillary (!) 119 16 --  01/12/22 0100 114/70 -- -- (!) 112 -- --  01/12/22 0045 120/63 -- -- (!) 129 -- --  01/12/22 0004 118/78 -- -- (!) 138 -- --  01/11/22 2300 (!) 102/50 -- -- (!) 114 -- --  01/11/22 2230 116/72 (!) 102.7 F (39.3 C) Axillary (!) 143 -- --  01/11/22 2200 111/65 99.4 F (37.4 C) Oral (!) 131 17 --  01/11/22 2130 97/60 -- -- (!) 136 16 --  01/11/22 2100 115/60 -- -- (!) 131 17 --  01/11/22 2031 (!) 134/98 -- -- -- 17 --  01/11/22 2001 (!) 124/57 -- -- (!) 122 17 --  01/11/22 1930 113/60 100 F (37.8 C) Oral (!) 130 -- --  01/11/22 1900 114/66 -- -- (!) 112 -- --  01/11/22 1830 120/75 (!) 101.1 F (38.4 C) Axillary (!) 118 -- --  01/11/22 1800 (!) 112/57 -- -- (!) 119 -- --  01/11/22 1733 -- 100.3 F (37.9 C) Axillary -- -- --  01/11/22 1731 104/74 -- -- (!) 112 -- --  01/11/22 1708 -- 100 F (37.8 C) Axillary -- -- --  01/11/22 1701 108/61 -- -- (!) 112 -- --  01/11/22 1632 (!)  114/57 -- -- (!) 116 -- --  01/11/22 1628 -- 99.3 F (37.4 C) Axillary -- -- --  01/11/22 1618 122/90 -- -- (!) 120 -- --  01/11/22 1530 118/75 -- -- (!) 105 -- --  01/11/22 1500 111/70 -- -- (!) 103 -- --  01/11/22 1431 115/75 98.6 F (37 C) Axillary (!) 102 -- --  01/11/22 1400 124/76 -- -- (!) 106 -- --  01/11/22 1337 103/89 -- -- (!) 113 -- --  01/11/22 1335 103/89 -- -- (!) 113 -- --    Physical Exam:  General: alert and cooperative Lochia: appropriate Uterine Fundus: firm, non tender  Incision: exam deferred today  DVT Evaluation: No evidence of DVT seen on physical exam. Negative Homan's sign.      Latest Ref Rng & Units 01/12/2022    5:37 AM 01/11/2022   10:00 AM  CBC  WBC 4.0 - 10.5 K/uL 19.6  10.4   Hemoglobin 12.0 - 15.0 g/dL 01/13/2022  02.6   Hematocrit 36.0 - 46.0 % 36.1  40.9   Platelets 150 - 400 K/uL 267  332  B+ Rub immune   Assessment/Plan: PPD # 0. SVD PP care - routine, LC to assist Chorio in labor- no more fevers, continue to observe, Cefoxitin last dose at delivery  Hypothyroid cont same dose     LOS: 1 day   Robley Fries, MD 01/12/2022, 1:28 PM

## 2022-01-13 LAB — SURGICAL PATHOLOGY

## 2022-01-13 NOTE — Progress Notes (Signed)
Epidural removed by anesthesia Dr. Chancy Milroy.

## 2022-01-13 NOTE — Lactation Note (Signed)
This note was copied from a baby's chart. Lactation Consultation Note  Patient Name: Taylor Wong ACZYS'A Date: 01/13/2022 Reason for consult: Follow-up assessment;Maternal endocrine disorder Age:30 hours  P1, Assisted with latching at breast in both football and cross cradle position.  Mother's areolas are very compressible.  Baby had difficult time sustaining latch.  A few sucks noted.  Attempted with 5 french feeding tube. Set up DEBP with 27 mm flanges and suggest mother pump q 3 hours. Used hand pump to evert nipple prior to latch attempt and supplement with breastmilk and formula. Plan is to continuing to attempt breastfeeding and call for help as needed.   Maternal Data Has patient been taught Hand Expression?: Yes Does the patient have breastfeeding experience prior to this delivery?: No  Feeding Mother's Current Feeding Choice: Breast Milk and Formula  LATCH Score Latch: Repeated attempts needed to sustain latch, nipple held in mouth throughout feeding, stimulation needed to elicit sucking reflex.  Audible Swallowing: None  Type of Nipple: Everted at rest and after stimulation (short shaft)  Comfort (Breast/Nipple): Soft / non-tender  Hold (Positioning): Assistance needed to correctly position infant at breast and maintain latch.  LATCH Score: 6   Lactation Tools Discussed/Used Tools: Pump;Flanges;51F feeding tube / Syringe  Interventions Interventions: Breast feeding basics reviewed;Assisted with latch;Hand express;Pre-pump if needed;Hand pump;DEBP;Education  Discharge Pump: DEBP;Personal Artelia Laroche)  Consult Status Consult Status: Follow-up Date: 01/14/22 Follow-up type: In-patient    Dahlia Byes [facility] 01/13/2022, 8:50 AM

## 2022-01-13 NOTE — Lactation Note (Addendum)
This note was copied from a baby's chart. Lactation Consultation Note  Patient Name: Taylor Wong WIOMB'T Date: 01/13/2022 Reason for consult: Follow-up assessment;Mother's request;Difficult latch;Primapara;1st time breastfeeding;Term;Breastfeeding assistance Age:31 hours  LC assisted with latching infant at the breast with signs of milk transfer. Parents aware to keep infant STS during feedings. RN to update chart on extent of this feeding.     Maternal Data Has patient been taught Hand Expression?: Yes  Feeding Mother's Current Feeding Choice: Breast Milk and Formula  LATCH Score Latch: Repeated attempts needed to sustain latch, nipple held in mouth throughout feeding, stimulation needed to elicit sucking reflex.  Audible Swallowing: Spontaneous and intermittent  Type of Nipple: Everted at rest and after stimulation  Comfort (Breast/Nipple): Soft / non-tender  Hold (Positioning): Assistance needed to correctly position infant at breast and maintain latch.  LATCH Score: 8   Lactation Tools Discussed/Used Tools: Pump;Flanges Flange Size: 24;27 Breast pump type: Double-Electric Breast Pump Pump Education: Setup, frequency, and cleaning;Milk Storage Reason for Pumping: increase stimulation Pumping frequency: post pump afer each feeding for 15 mins  Interventions Interventions: Breast feeding basics reviewed;Assisted with latch;Skin to skin;Breast massage;Hand express;Pre-pump if needed;Breast compression;Adjust position;Support pillows;Position options;Expressed milk;DEBP;Education;Pace feeding;LC Psychologist, educational;Visual merchandiser education  Discharge    Consult Status Consult Status: Follow-up Date: 01/14/22 Follow-up type: In-patient    [name]  [name] 01/13/2022, 11:21 PM

## 2022-01-13 NOTE — Progress Notes (Signed)
    PPD # 1 S/P SVD  Live born female  Birth Weight: 6 lb 12.3 oz (3070 g) APGAR: 8, 9  Newborn Delivery   Birth date/time: 01/12/2022 00:38:00 Delivery type: Vaginal, Spontaneous     Baby name: undecided Delivering provider: MODY, VAISHALI  Episiotomy:None   Lacerations:Vaginal;2nd degree;Perineal   Feeding: breast and bottle - reports desiring supplementation perceived low supply, reports seen by lactation   Pain control at delivery: Epidural   S:  Reports feeling well, better than yesterday             Tolerating po/ No nausea or vomiting             Bleeding is light             Pain controlled with acetaminophen and ibuprofen (OTC)             Up ad lib / ambulatory / voiding without difficulties   O:  A & O x 3, in no apparent distress              VS:  Vitals:   01/12/22 1217 01/12/22 1600 01/12/22 2015 01/13/22 0502  BP: 90/61 90/64 107/83 114/84  Pulse: 94 90 97 (!) 101  Resp: 18 18 16 18   Temp: 98.4 F (36.9 C) (!) 97.5 F (36.4 C) 97.9 F (36.6 C) 97.7 F (36.5 C)  TempSrc: Oral Oral Oral Oral  SpO2: 98% 98% 100% 100%  Weight:      Height:        LABS:  Recent Labs    01/11/22 1000 01/12/22 0537  WBC 10.4 19.6*  HGB 12.8 11.2*  HCT 40.9 36.1  PLT 332 267    Blood type: --/--/B POS (07/17 1000)  Rubella: Immune (12/22 0000)   I&O: I/O last 3 completed shifts: In: -  Out: 900 [Urine:500; Blood:400]          No intake/output data recorded.  Vaccines: TDaP          up to date   Gen: AAO x 3, NAD  Abdomen: soft, non-tender, non-distended             Fundus: firm, non-tender, U- 1  Perineum: well approximated  Lochia: small  Extremities: no calf pain or tenderness    A/P: PPD # 1 31 y.o., G1P1001   Principal Problem:   Postpartum care following vaginal delivery 7/18 Active Problems:   Normal labor and delivery   Chorioamnionitis - afebrile last 24 hours, no evidence of endometritis, continue to monitor, last dose Cefoxitin at  delivery   SVD 7/18   Second degree perineal laceration with bilateral vaginal lacerations - Dermoplast, Tuck pads   Hypothyroidism - Synthroid 50 mcg PO daily    Doing well - stable status  Routine post partum orders  Lactation support  Anticipate discharge tomorrow    8/18, BSN, RN, [facility] 01/13/2022, 11:42 AM

## 2022-01-13 NOTE — Anesthesia Post-op Follow-up Note (Signed)
  Anesthesia Pain Follow-up Note  Patient: Taylor Wong  Day #: 1  Date of Follow-up: 01/13/2022 Time: 12:24 PM  Last Vitals:  Vitals:   01/12/22 2015 01/13/22 0502  BP: 107/83 114/84  Pulse: 97 (!) 101  Resp: 16 18  Temp: 36.6 C 36.5 C  SpO2: 100% 100%    Level of Consciousness: alert  Pain: mild   Side Effects:None and patient sitting up  Catheter Site Exam:clean     Plan: Catheter removed/tip intact at surgeon's request  Trevor Iha

## 2022-01-13 NOTE — Lactation Note (Addendum)
This note was copied from a baby's chart. Lactation Consultation Note  Patient Name: Taylor Wong JASNK'N Date: 01/13/2022 Reason for consult: Follow-up assessment;Mother's request;Difficult latch;Term;Breastfeeding assistance Age:30 hours Mom working on getting infant to latch at the breast offering more volume with 5 french primed with formula. LC assisted setting up latch, primed with formula (54ml). Mom still latched and feeding, infant mostly sucking as formula is pushed.  Mom pre pumping with hand pump before latching.   Mom encouraged to get into pumping routine to maintain her milk supply.   Plan 1. To feed based on cues 8-12x 24hr period.  2. Mom latch with assistance of 5 french, then follow with supplement. If infant breaks latch, Mom continue supplementation with pace bottle feeding and slow flow nipple. BF supplementation guide provided. (18-25 ml per feeding) If infant not latching at the breast to offer more.  3. Post pump after each feeding for 15 mins   RN, Pleas Koch, to check pump as Mom states only one side is working.   All questions answered at the end of the visit.  Maternal Data    Feeding Mother's Current Feeding Choice: Breast Milk and Formula  LATCH Score Latch: Repeated attempts needed to sustain latch, nipple held in mouth throughout feeding, stimulation needed to elicit sucking reflex.  Audible Swallowing: A few with stimulation (swallowing with supplement at the breast only.)  Type of Nipple: Flat  Comfort (Breast/Nipple): Soft / non-tender  Hold (Positioning): Assistance needed to correctly position infant at breast and maintain latch.  LATCH Score: 6   Lactation Tools Discussed/Used Tools: 30F feeding tube / Syringe  Interventions Interventions: Breast feeding basics reviewed;Assisted with latch;Breast massage;Breast compression;Expressed milk;Comfort gels;Education;Pace feeding;LC Psychologist, educational;Warden/ranger education  Discharge    Consult Status Consult Status: Follow-up Date: 01/14/22 Follow-up type: In-patient    Taylor Wong  Taylor Wong 01/13/2022, 6:37 PM

## 2022-01-14 ENCOUNTER — Inpatient Hospital Stay (HOSPITAL_COMMUNITY): Payer: 59

## 2022-01-14 ENCOUNTER — Inpatient Hospital Stay (HOSPITAL_COMMUNITY): Admission: AD | Admit: 2022-01-14 | Payer: 59 | Source: Home / Self Care | Admitting: Obstetrics & Gynecology

## 2022-01-14 MED ORDER — COCONUT OIL OIL: 1.0000 | TOPICAL_OIL | 0 refills | Status: AC | PRN

## 2022-01-14 MED ORDER — ACETAMINOPHEN 325 MG PO TABS: 650.0000 mg | ORAL_TABLET | ORAL | Status: AC | PRN

## 2022-01-14 MED ORDER — IBUPROFEN 600 MG PO TABS: 600.0000 mg | ORAL_TABLET | Freq: Four times a day (QID) | ORAL | 0 refills | Status: AC

## 2022-01-14 MED ORDER — BENZOCAINE-MENTHOL 20-0.5 % EX AERO: 1.0000 | INHALATION_SPRAY | CUTANEOUS | Status: AC | PRN

## 2022-01-14 NOTE — Discharge Summary (Signed)
OB Discharge Summary  Patient Name: Taylor Wong DOB: 07-05-1990 MRN: [id number]  Date of admission: 01/11/2022 Delivering provider: MODY, VAISHALI   Admitting diagnosis: Normal labor and delivery [O80] SVD (spontaneous vaginal delivery) [O80] Intrauterine pregnancy: [redacted]w[redacted]d     Secondary diagnosis: Patient Active Problem List   Diagnosis Date Noted   SVD 7/18 01/12/2022   Postpartum care following vaginal delivery 7/18 01/12/2022   Second degree perineal laceration with bilateral vaginal lacerations 01/12/2022   Hypothyroidism 01/12/2022   Normal labor and delivery 01/11/2022   Chorioamnionitis 01/11/2022   Additional problems:none   Date of discharge: 01/14/2022   Discharge diagnosis: Principal Problem:   Postpartum care following vaginal delivery 7/18 Active Problems:   Normal labor and delivery   Chorioamnionitis   SVD 7/18   Second degree perineal laceration with bilateral vaginal lacerations   Hypothyroidism                                                              Post partum procedures: none  Augmentation: AROM and Pitocin Pain control: Epidural  Laceration:Vaginal;2nd degree;Perineal  Episiotomy:None  Complications: Intrauterine Inflammation or infection (Chorioamniotis)  Hospital course:  Onset of Labor With Vaginal Delivery      31 y.o. yo G1P1001 at [redacted]w[redacted]d was admitted in Latent Labor on 01/11/2022. Patient had an uncomplicated labor course as follows:  Membrane Rupture Time/Date: 1:55 PM ,01/11/2022   Delivery Method:Vaginal, Spontaneous  Episiotomy: None  Lacerations:  Vaginal;2nd degree;Perineal  Patient had an uncomplicated postpartum course.  She is ambulating, tolerating a regular diet, passing flatus, and urinating well. Patient is discharged home in stable condition on 01/14/22.  Newborn Data: Birth date:01/12/2022  Birth time:12:38 AM  Gender:Female  Living status:Living  Apgars:8 ,9  Weight:3070 g   Physical exam  Vitals:   01/12/22  2015 01/13/22 0502 01/13/22 1450 01/13/22 2029  BP: 107/83 114/84 109/83 109/79  Pulse: 97 (!) 101 94 86  Resp: 16 18 18 19   Temp: 97.9 F (36.6 C) 97.7 F (36.5 C) 97.9 F (36.6 C) 98.1 F (36.7 C)  TempSrc: Oral Oral Oral Oral  SpO2: 100% 100% 100% 100%  Weight:      Height:       General: alert, cooperative, and no distress Lochia: appropriate Uterine Fundus: firm Incision: N/A Perineum: repair intact, no edema DVT Evaluation: No cords or calf tenderness. No significant calf/ankle edema. Labs: Lab Results  Component Value Date   WBC 19.6 (H) 01/12/2022   HGB 11.2 (L) 01/12/2022   HCT 36.1 01/12/2022   MCV 72.1 (L) 01/12/2022   PLT 267 01/12/2022       No data to display            01/13/2022   12:33 AM  Edinburgh Postnatal Depression Scale Screening Tool  I have been able to laugh and see the funny side of things. 0  I have looked forward with enjoyment to things. 0  I have blamed myself unnecessarily when things went wrong. 0  I have been anxious or worried for no good reason. 0  I have felt scared or panicky for no good reason. 0  Things have been getting on top of me. 1  I have been so unhappy that I have had difficulty sleeping. 1  I have felt sad  or miserable. 0  I have been so unhappy that I have been crying. 0  The thought of harming myself has occurred to me. 0  Edinburgh Postnatal Depression Scale Total 2   Vaccines: TDaP          UTD   Discharge instruction:  per After Visit Summary,  [facility] OB booklet and  "Understanding Mother & Baby Care" hospital booklet  After Visit Meds:  Allergies as of 01/14/2022   No Known Allergies      Medication List     TAKE these medications    acetaminophen 325 MG tablet Commonly known as: Tylenol Take 2 tablets (650 mg total) by mouth every 4 (four) hours as needed (for pain scale < 4).   benzocaine-Menthol 20-0.5 % Aero Commonly known as: DERMOPLAST Apply 1 Application topically as needed for  irritation (perineal discomfort).   coconut oil Oil Apply 1 Application topically as needed.   ferrous sulfate 325 (65 FE) MG tablet Take 325 mg by mouth daily with breakfast.   ibuprofen 600 MG tablet Commonly known as: ADVIL Take 1 tablet (600 mg total) by mouth every 6 (six) hours.   levothyroxine 50 MCG tablet Commonly known as: SYNTHROID Take 50 mcg by mouth daily before breakfast.   prenatal multivitamin Tabs tablet Take 1 tablet by mouth daily at 12 noon.               Discharge Care Instructions  (From admission, onward)           Start     Ordered   01/14/22 0000  Discharge wound care:       Comments: Sitz baths 2 times /day with warm water x 1 week. May add herbals: 1 ounce dried comfrey leaf* 1 ounce calendula flowers 1 ounce lavender flowers  Supplies can be found online at [employer] sources at [employer], [employer]  1/2 ounce dried uva ursi leaves 1/2 ounce witch hazel blossoms (if you can find them) 1/2 ounce dried sage leaf 1/2 cup sea salt Directions: Bring 2 quarts of water to a boil. Turn off heat, and place 1 ounce (approximately 1 large handful) of the above mixed herbs (not the salt) into the pot. Steep, covered, for 30 minutes.  Strain the liquid well with a fine mesh strainer, and discard the herb material. Add 2 quarts of liquid to the tub, along with the 1/2 cup of salt. This medicinal liquid can also be made into compresses and peri-rinses.   01/14/22 1017            Diet: routine diet  Activity: Advance as tolerated. Pelvic rest for 6 weeks.   Postpartum contraception: TBA in office  Newborn Data: Live born female  Birth Weight: 6 lb 12.3 oz (3070 g) APGAR: 8, 9  Newborn Delivery   Birth date/time: 01/12/2022 00:38:00 Delivery type: Vaginal, Spontaneous      named Aaredhya Baby Feeding: Bottle and Breast Disposition:home with mother   Delivery Report:  Review the Delivery Report for details.     Follow up:  Follow-up Information     Shea Evans, MD. Schedule an appointment as soon as possible for a visit in 6 week(s).   Specialty: Obstetrics and Gynecology Why: For Postpartum follow-up Contact information: [address] [place] [address] [phone]                   Signed: Neta Mends, CNM, MSN 01/14/2022, 8:52 AM

## 2022-01-14 NOTE — Discharge Instructions (Signed)
Lactation outpatient support - home visit ° ° °Jessica Bowers, IBCLC (lactation consultant)  & Birth Doula ° °Phone (text or call): [phone] °Email: [email or web address] °[email or web address] ° ° °Linda Coppola °RN, MHA, IBCLC °at [facility]: Lactation Consultant ° °[email or web address] °Mail: [email or web address] °Tel: [phone] ° ° °Additional breastfeeding resources: ° °[employer] [facility] [facility] °https://ibconline.ca/information-sheets/ ° °[employer] of  ° °www.lllofnc.org ° ° °Other Resources: ° °Chiropractic specialist  ° °Dr. Leanna Hastings °[email or web address] ° ° °Craniosacral therapy for baby ° °Erin Balkind  °https://cbebodywork.[email or web address]/ ° °

## 2022-01-20 ENCOUNTER — Telehealth (HOSPITAL_COMMUNITY): Payer: Self-pay | Admitting: *Deleted

## 2022-01-20 NOTE — Telephone Encounter (Signed)
Mom reports feeling good. No concerns about herself at this time. Mom wants to do EPDS herself online. Given information to find it. [facility] score=2) Mom reports baby is doing well. Feeding, peeing, and pooping without difficulty. Safe sleep reviewed. Mom reports no concerns about baby at present.  Duffy Rhody, RN 01-20-2022 at 1:05pm

## 2022-05-18 IMAGING — US US BREAST*R* LIMITED INC AXILLA
1 series · 5 of 5 positions shown · non-contrast
Comparison: None.

CLINICAL DATA: 29-year-old female with a physician palpated right
breast lump.

EXAM:
ULTRASOUND OF THE RIGHT BREAST

[Series 1: us breast*right* limited inc axilla · 0.07mm/px · 5 of 5 slices shown]
[im 1/5]
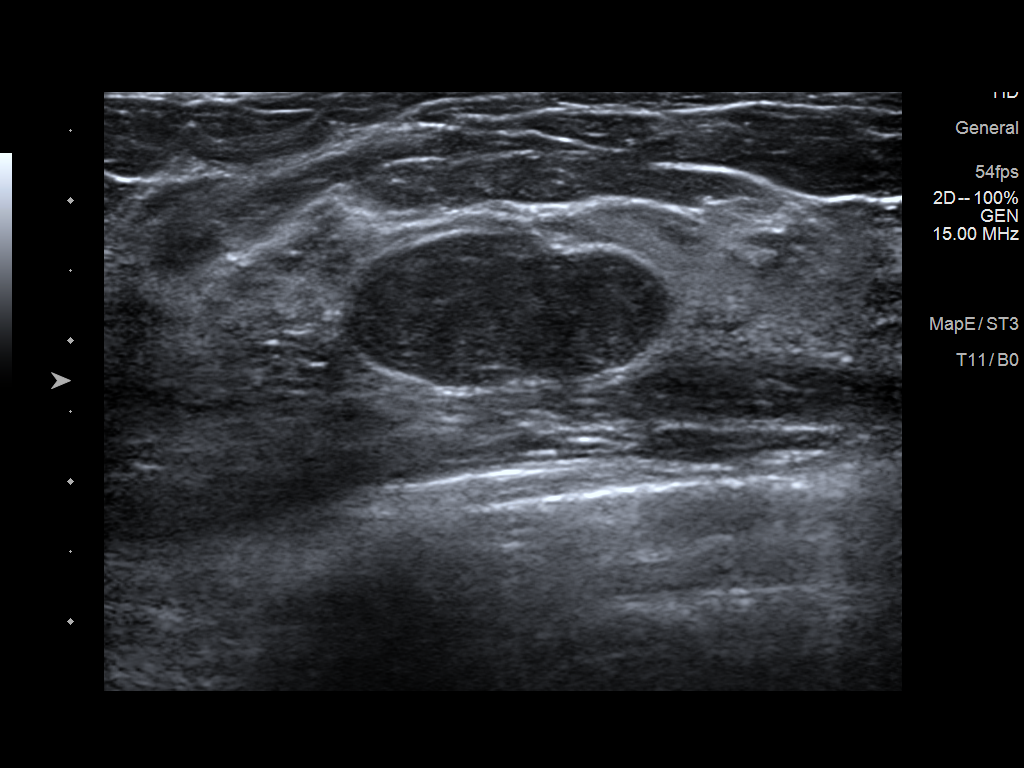
[im 2/5]
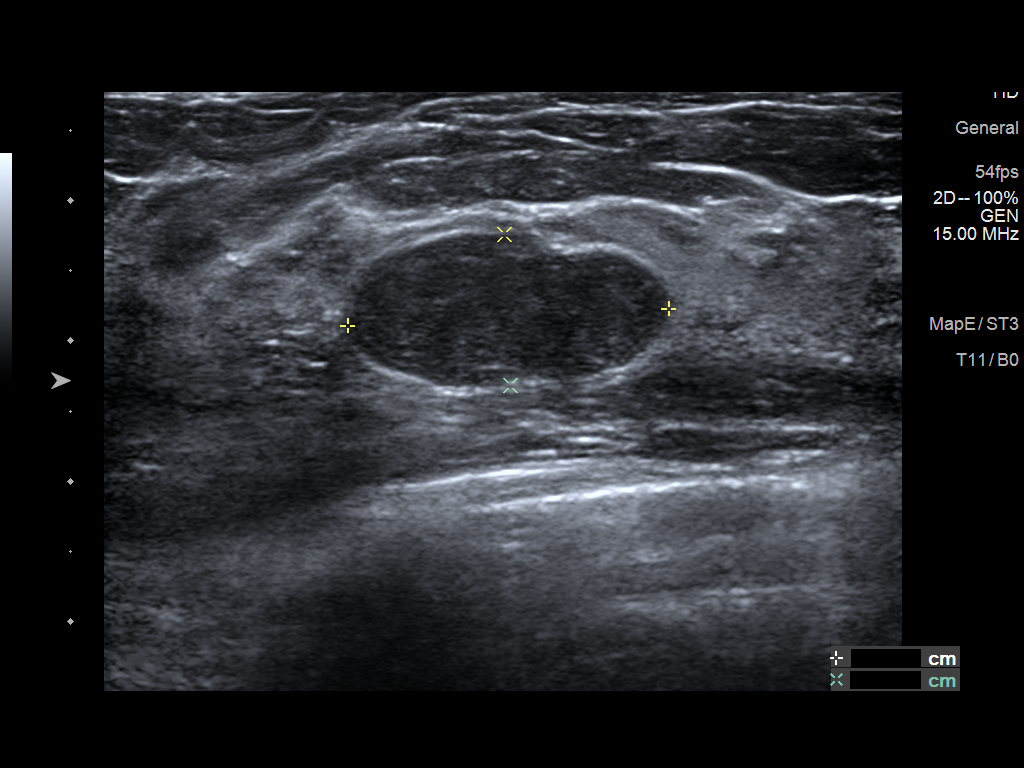
[im 3/5]
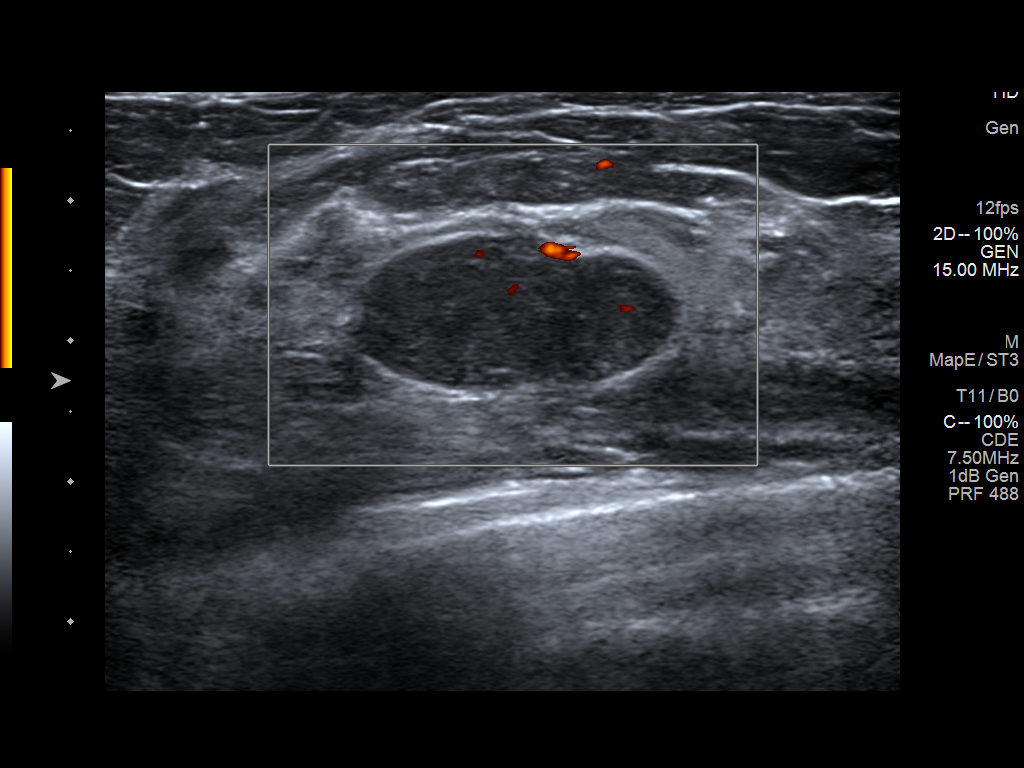
[im 4/5]
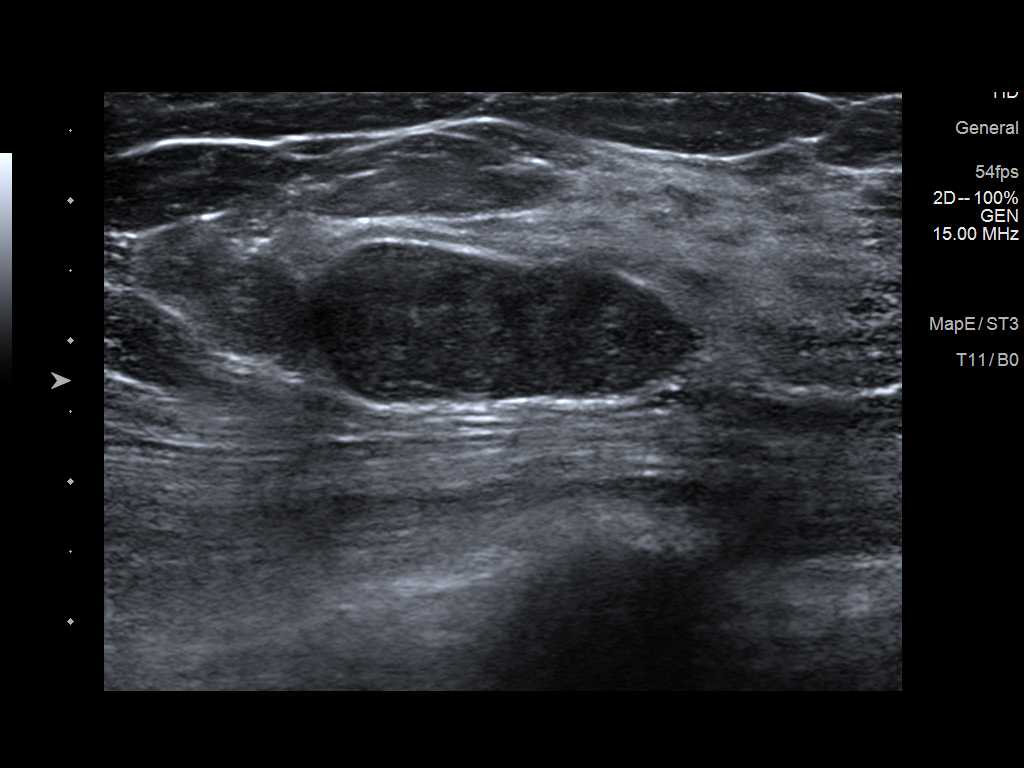
[im 5/5]
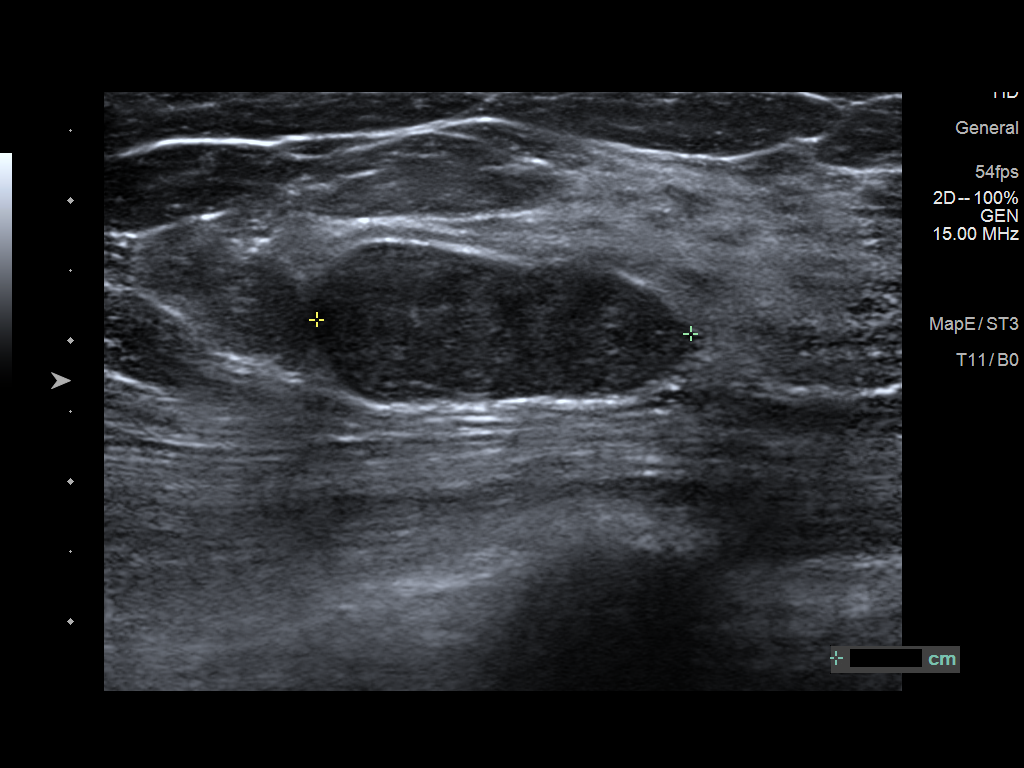

[5 of 5 positions shown; findings below may reference images not displayed]

FINDINGS: Targeted ultrasound is performed, showing an oval, circumscribed
hypoechoic mass with peripheral vascularity at the 12 o'clock
position 5 cm from the nipple. It measures 2.7 x 2.3 x 1.1 cm.
IMPRESSION: Probably benign, probable right breast fibroadenoma corresponding
with the physician palpated lump. Recommendation is for short-term
imaging follow-up.

RECOMMENDATION:
Right breast ultrasound in 6 months.

I have discussed the findings and recommendations with the patient.
If applicable, a reminder letter will be sent to the patient
regarding the next appointment.

BI-RADS CATEGORY  3: Probably benign.

## 2023-02-20 IMAGING — US US BREAST*R* LIMITED INC AXILLA
1 series · 6 of 6 positions shown · non-contrast
Comparison: Previous exam(s).

CLINICAL DATA: 30-year-old female presenting for first delayed
follow-up of a probably benign right breast mass. Patient also
reports intermittent tenderness of a right axillary fat pad.

EXAM:
ULTRASOUND OF THE RIGHT BREAST

[Series 1: us breast*right* limited inc axilla · 0.07mm/px · 6 of 6 slices shown]
[im 1/6]
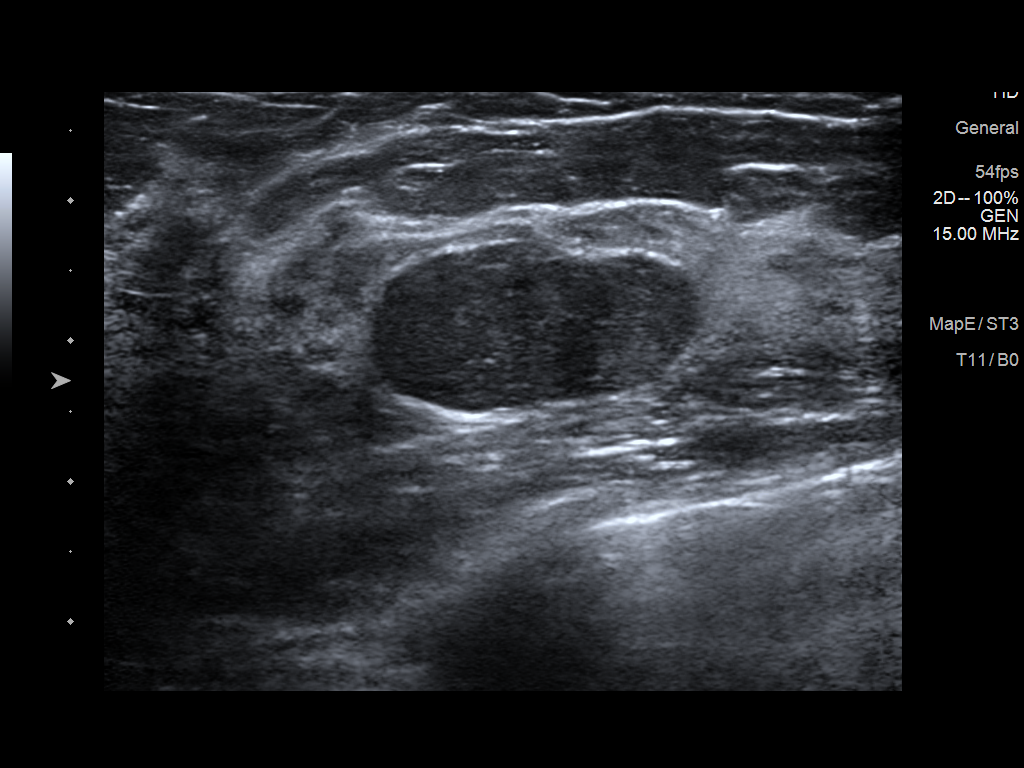
[im 2/6]
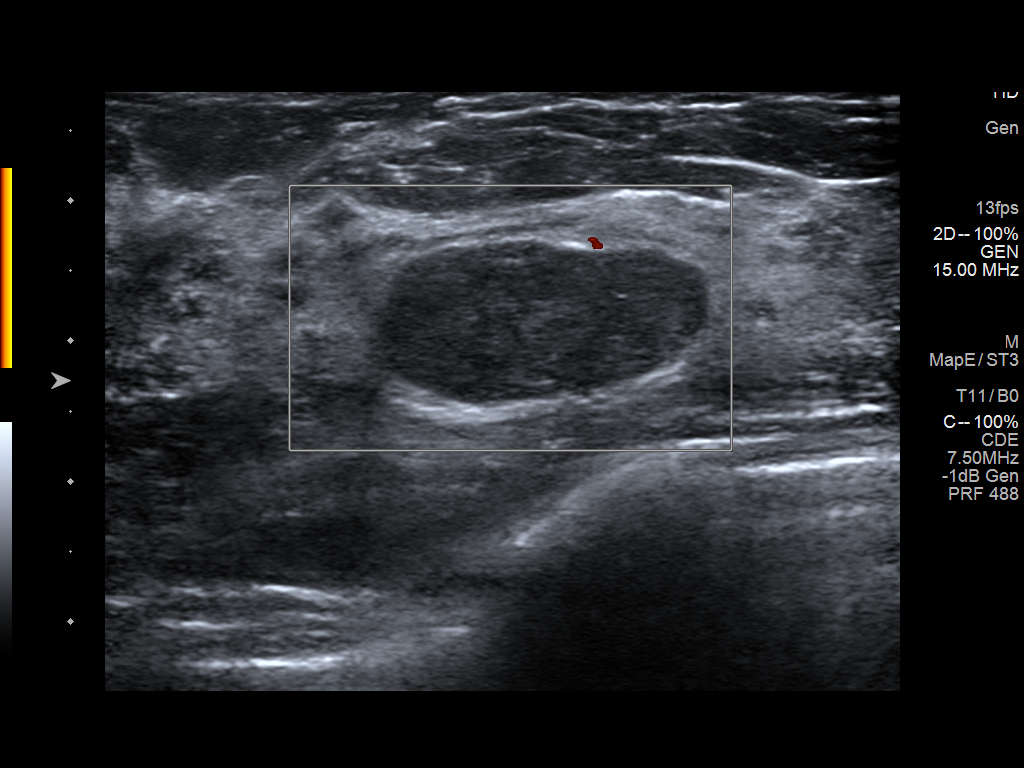
[im 3/6]
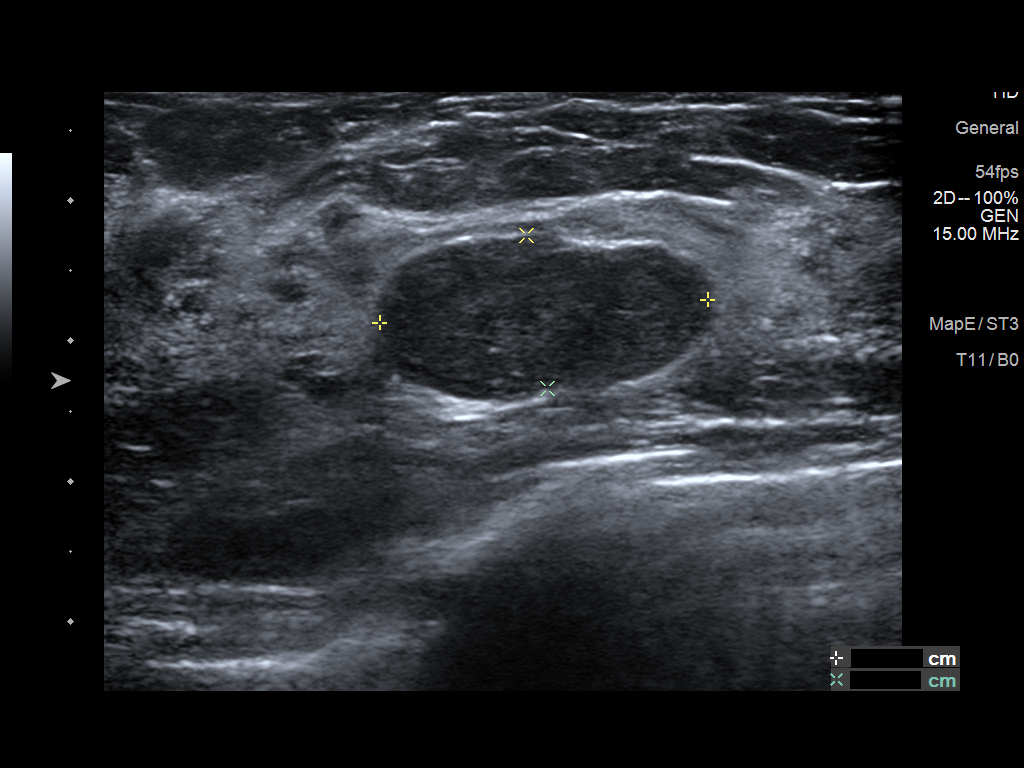
[im 4/6]
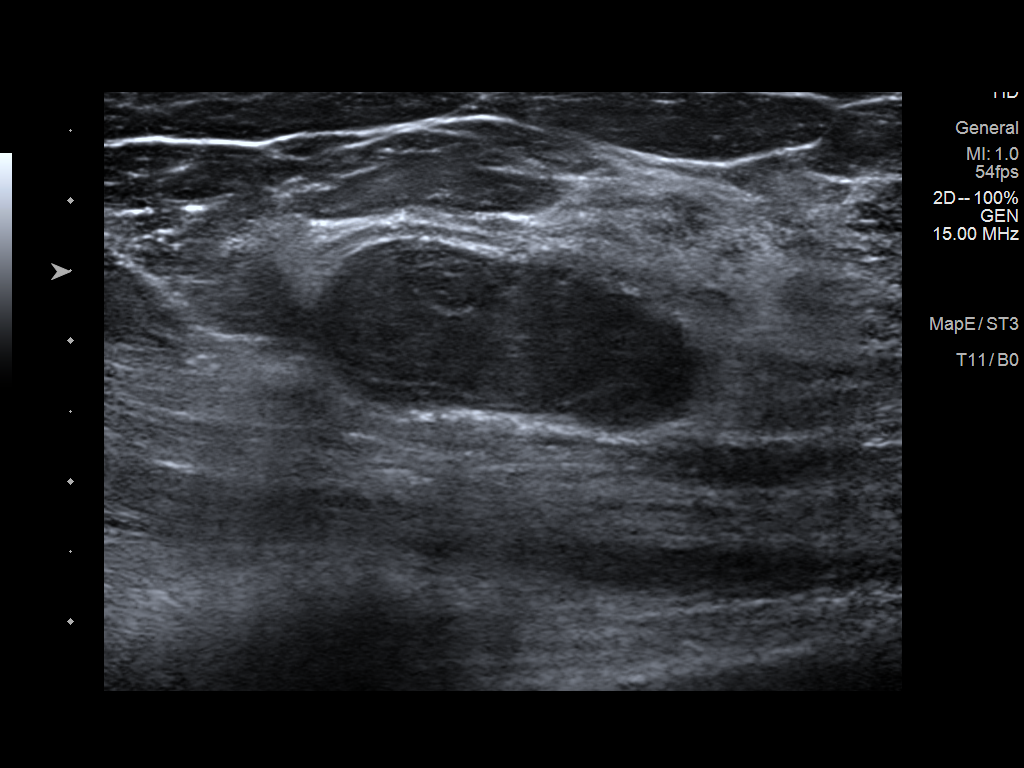
[im 5/6]
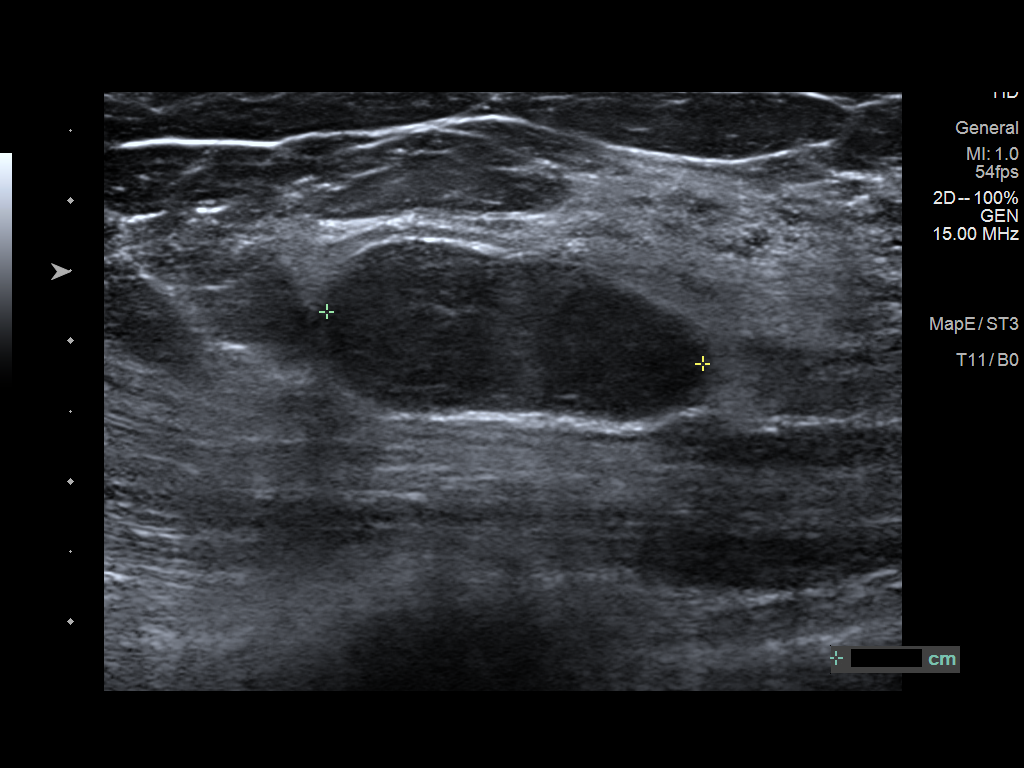
[im 6/6]
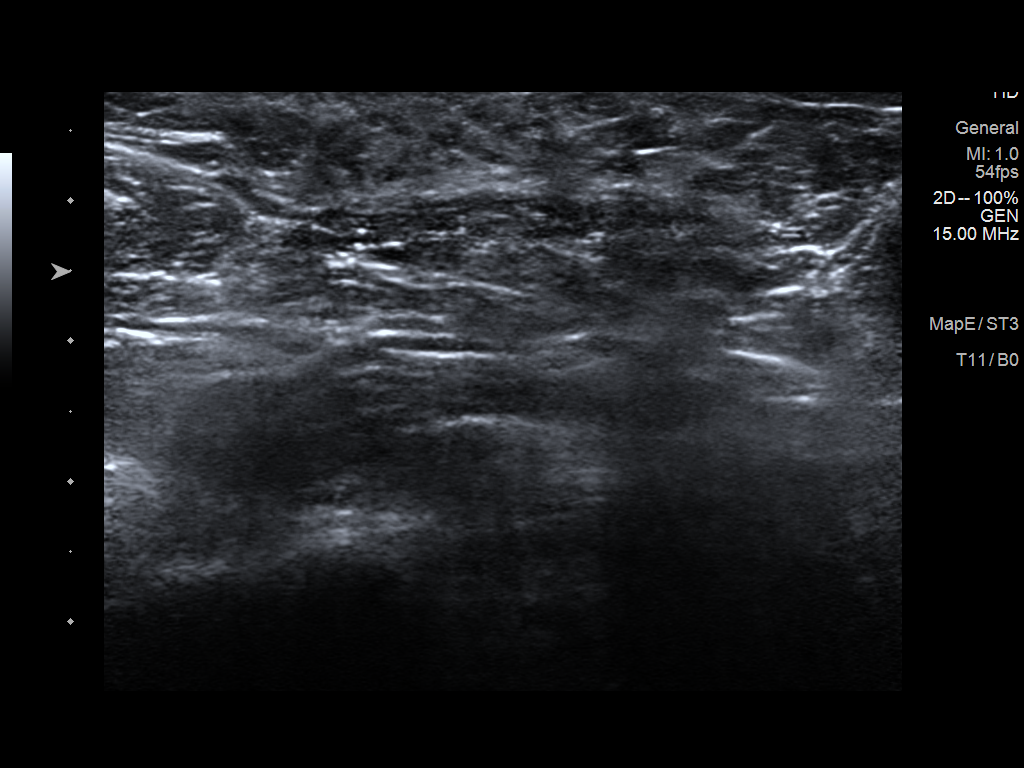

[6 of 6 positions shown; findings below may reference images not displayed]

FINDINGS: Targeted ultrasound is performed, showing stable appearance of an
oval, circumscribed hypoechoic mass at the 12 o'clock position 5 cm
from the nipple. It measures 2.7 x 2.3 x 1.1 cm (previously 2.7 x
2.3 x 1.1 cm).

Evaluation of the right axillary fat pad demonstrates no suspicious
findings.
IMPRESSION: 1. Stable, probably benign right breast mass likely representing a
fibroadenoma. Recommendation is this for additional ultrasound
follow-up in 6 months.
2. No suspicious right axillary findings. Recommendation is for
clinical and symptomatic follow-up.

RECOMMENDATION:
Right breast ultrasound in 6 months.

I have discussed the findings and recommendations with the patient.
If applicable, a reminder letter will be sent to the patient
regarding the next appointment.

BI-RADS CATEGORY  3: Probably benign.

## 2024-04-02 IMAGING — US US SOFT TISSUE HEAD/NECK
1 series · 10 of 10 positions shown · non-contrast
Comparison: None Available.

CLINICAL DATA: Localized swelling of enlarged lymph nodes.
Bilateral clavicular swelling

EXAM:
ULTRASOUND OF HEAD/NECK SOFT TISSUES
TECHNIQUE: Ultrasound examination of the head and neck soft tissues was
performed in the area of clinical concern.

[Series 1: us soft tissue head/neck · 0.06mm/px · 10 of 10 slices shown]
[im 1/10]
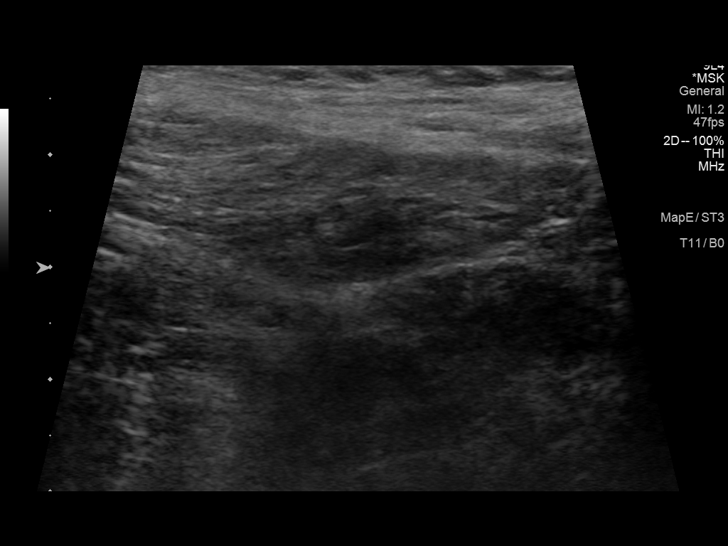
[im 2/10]
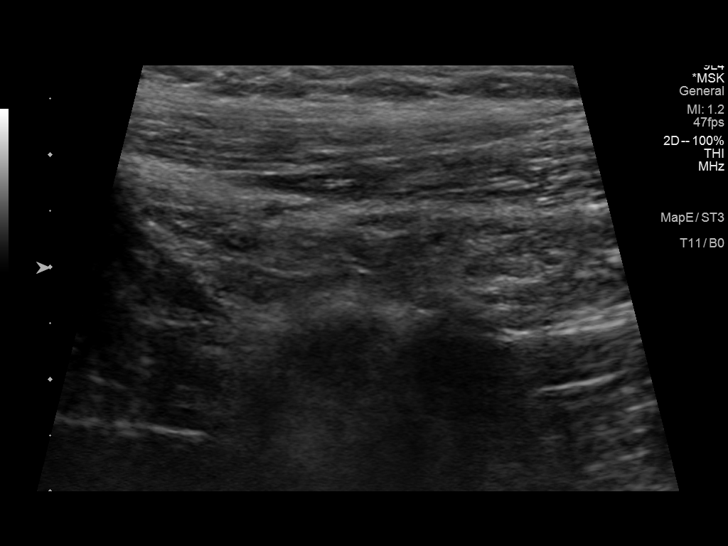
[im 3/10]
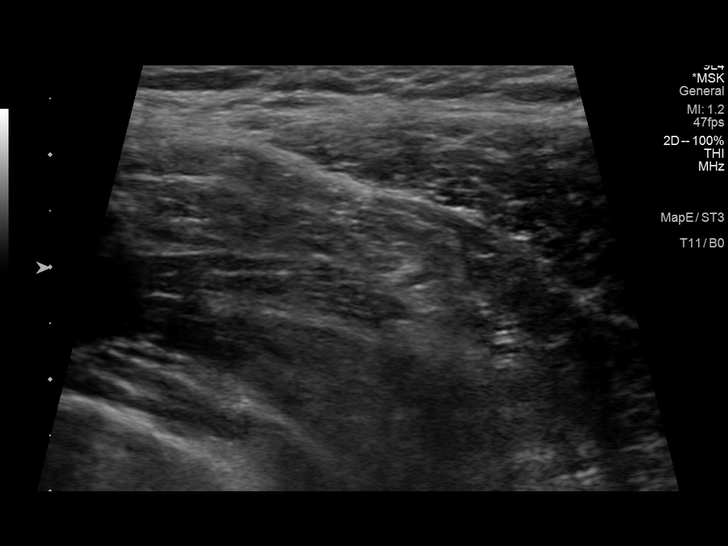
[im 4/10]
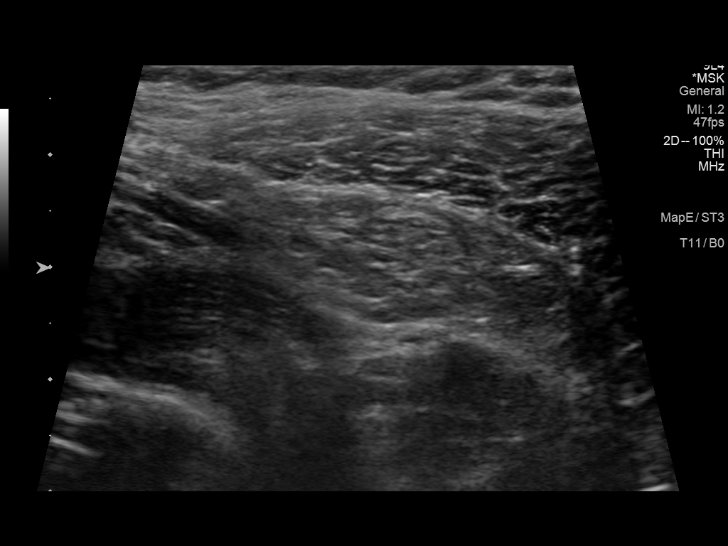
[im 5/10]
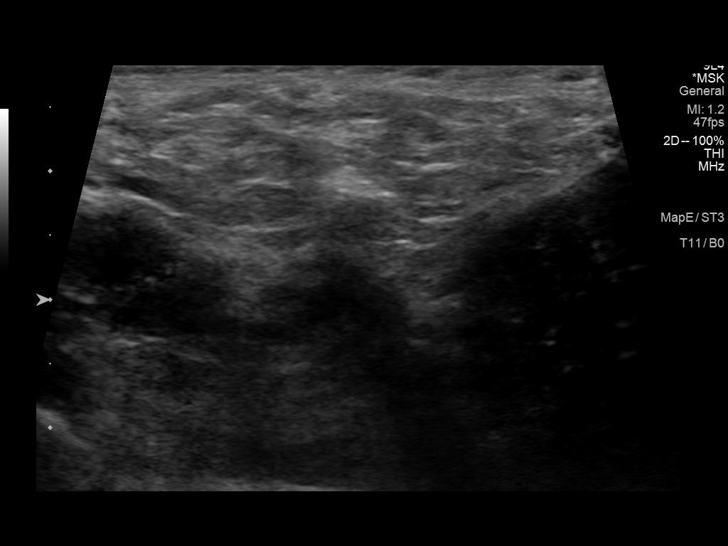
[im 6/10]
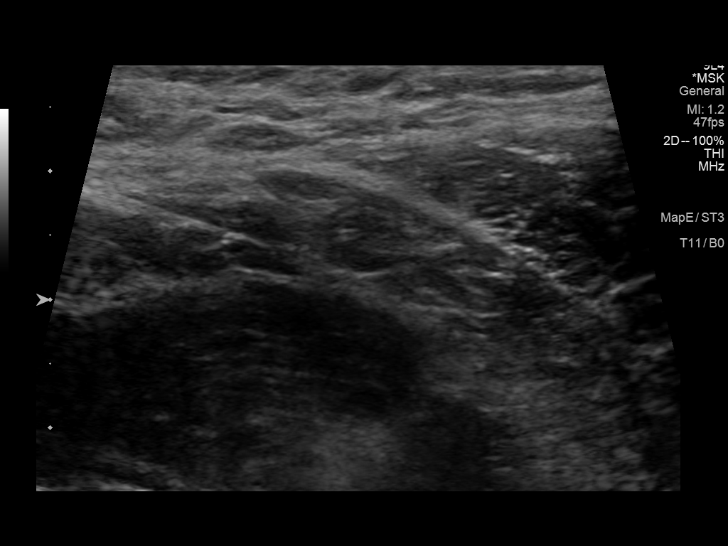
[im 7/10]
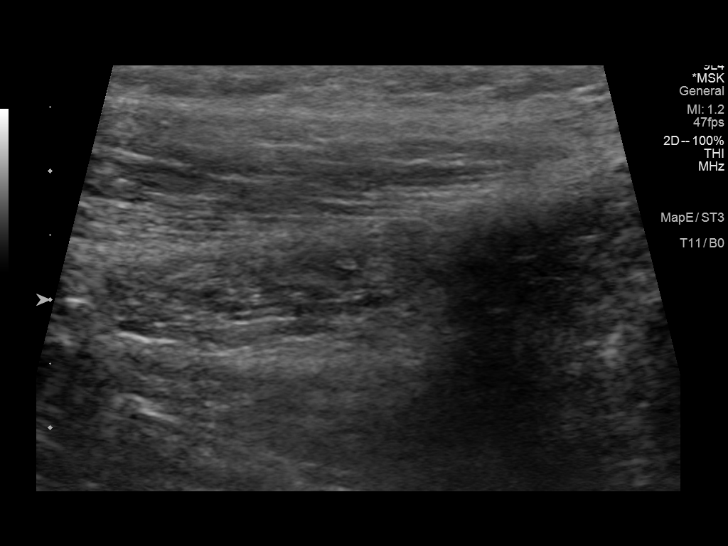
[im 8/10]
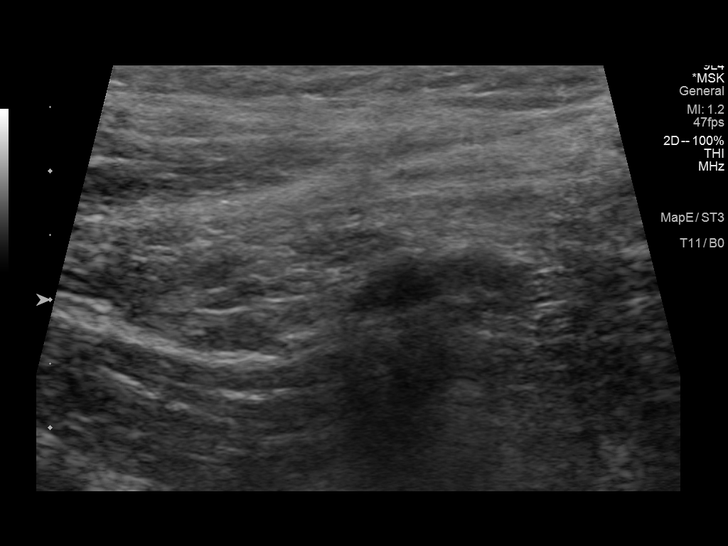
[im 9/10]
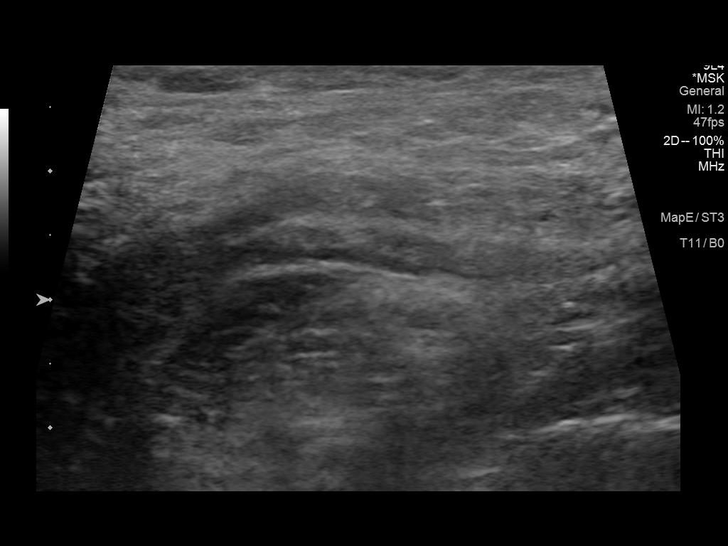
[im 10/10]
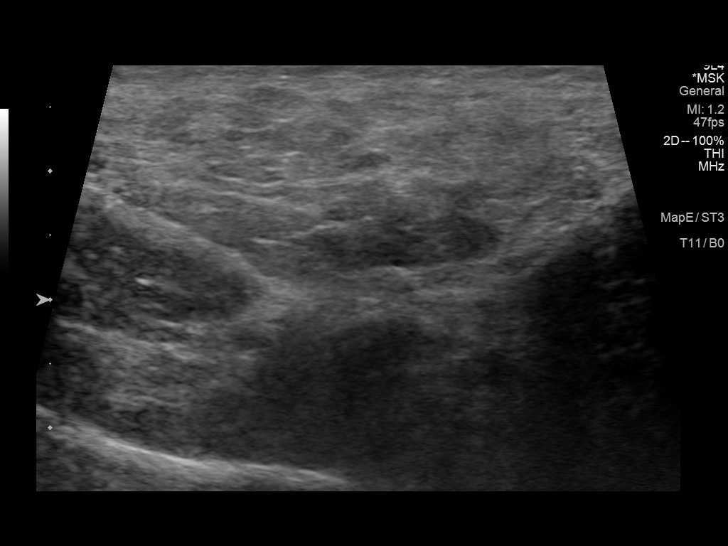

[10 of 10 positions shown; findings below may reference images not displayed]

FINDINGS: No adenopathy is seen, only subcutaneous fat and muscle. No
collection or visible inflammation.
IMPRESSION: No visible mass or adenopathy.
